# Patient Record
Sex: Male | Born: 1950 | Race: Black or African American | Hispanic: No | Marital: Married | Smoking: Never smoker
Health system: Southern US, Community
[De-identification: ages and names within clinical notes are randomized; demographics above are authoritative.]

## PROBLEM LIST (undated history)

## (undated) DIAGNOSIS — I10 Essential (primary) hypertension: Secondary | ICD-10-CM

---

## 2006-05-31 ENCOUNTER — Other Ambulatory Visit: Payer: Self-pay

## 2006-05-31 ENCOUNTER — Emergency Department: Payer: Self-pay | Admitting: Unknown Physician Specialty

## 2006-06-04 ENCOUNTER — Emergency Department: Payer: Self-pay | Admitting: Emergency Medicine

## 2008-05-23 ENCOUNTER — Ambulatory Visit: Payer: Self-pay | Admitting: Gastroenterology

## 2018-07-25 ENCOUNTER — Emergency Department
Admission: EM | Admit: 2018-07-25 | Discharge: 2018-07-25 | Disposition: A | Discharge status: Home / Self Care | Payer: BC Managed Care – PPO | Attending: Student in an Organized Health Care Education/Training Program | Admitting: Student in an Organized Health Care Education/Training Program

## 2018-07-25 ENCOUNTER — Other Ambulatory Visit: Payer: Self-pay

## 2018-07-25 ENCOUNTER — Emergency Department: Payer: BC Managed Care – PPO

## 2018-07-25 ENCOUNTER — Encounter: Payer: Self-pay | Admitting: Emergency Medicine

## 2018-07-25 DIAGNOSIS — M5412 Radiculopathy, cervical region: Secondary | ICD-10-CM | POA: Insufficient documentation

## 2018-07-25 DIAGNOSIS — M4802 Spinal stenosis, cervical region: Secondary | ICD-10-CM

## 2018-07-25 DIAGNOSIS — M9981 Other biomechanical lesions of cervical region: Secondary | ICD-10-CM | POA: Insufficient documentation

## 2018-07-25 DIAGNOSIS — R531 Weakness: Secondary | ICD-10-CM | POA: Diagnosis present

## 2018-07-25 LAB — BASIC METABOLIC PANEL
Anion gap: 8 (ref 5–15)
BUN: 10 mg/dL (ref 8–23)
CO2: 25 mmol/L (ref 22–32)
Calcium: 9.1 mg/dL (ref 8.9–10.3)
Chloride: 105 mmol/L (ref 98–111)
Creatinine, Ser: 1.29 mg/dL — ABNORMAL HIGH (ref 0.61–1.24)
GFR calc Af Amer: >60 mL/min (ref >60)
GFR calc non Af Amer: 57 mL/min — ABNORMAL LOW (ref >60)
Glucose, Bld: 117 mg/dL — ABNORMAL HIGH (ref 70–99)
Potassium: 3.3 mmol/L — ABNORMAL LOW (ref 3.5–5.1)
Sodium: 138 mmol/L (ref 135–145)

## 2018-07-25 LAB — CBC
HCT: 41.3 % (ref 39.0–52.0)
Hemoglobin: 14.1 g/dL (ref 13.0–17.0)
MCH: 28.4 pg (ref 26.0–34.0)
MCHC: 34.1 g/dL (ref 30.0–36.0)
MCV: 83.1 fL (ref 80.0–100.0)
Platelets: 338 10*3/uL (ref 150–400)
RBC: 4.97 MIL/uL (ref 4.22–5.81)
RDW: 14.4 % (ref 11.5–15.5)
WBC: 4.7 10*3/uL (ref 4.0–10.5)
nRBC: 0 % (ref 0.0–0.2)

## 2018-07-25 LAB — TROPONIN I (HIGH SENSITIVITY): Troponin I (High Sensitivity): 9 ng/L (ref <18)

## 2018-07-25 MED ORDER — PREDNISONE 10 MG PO TABS: ORAL_TABLET | ORAL | 0 refills | Status: DC

## 2018-07-25 MED ORDER — SODIUM CHLORIDE 0.9% FLUSH: 3.0000 mL | Freq: Once | INTRAVENOUS | Status: DC

## 2018-07-25 NOTE — Discharge Instructions (Signed)
I am prescribing you steroid taper to help with your "pinched nerve.  ".  Please return to the ER if you develop any worsening symptoms including weakness or pain or for any new symptoms.

## 2018-07-25 NOTE — ED Triage Notes (Signed)
Pt reports he woke up this am and his right shoulder was stiff. Pt states can move it but it is painful. Denies SOB, injuries or other sx's.

## 2018-07-25 NOTE — ED Notes (Signed)
Pt ambulatory to the BR, NAD noted. No concerns at this time.

## 2018-07-25 NOTE — ED Notes (Signed)
Patient transported to CT 

## 2018-07-25 NOTE — ED Provider Notes (Signed)
[facility] Emergency Department Provider Note    First MD Initiated Contact with Patient 07/25/18 [phone]     (approximate)  I have reviewed the triage vital signs and the nursing notes.   HISTORY  Chief Complaint Shoulder Pain    HPI Gabriel Bennett is a 68 y.o. male presents the ER for evaluation of 1 week of right hand weakness.  Denies any pain.  Denies any trauma.  Denies any headache.  No lower extremity symptoms.  States that he will intermittently feel his arm shaking and feels weak.  States he does have a history of "pinched nerve "in his neck.  No previous surgeries.  Denies any chest pain or back pain.  No nausea or vomiting.  No blurry vision.    History reviewed. No pertinent past medical history. No family history on file. History reviewed. No pertinent surgical history. There are no active problems to display for this patient.     Prior to Admission medications   Medication Sig Start Date End Date Taking? Authorizing Provider  predniSONE (DELTASONE) 10 MG tablet Gabriel Bennett 1-2: Take 50mg (5 pills) Gabriel Bennett 3-4: Take 40mg (4) Gabriel Bennett 5-6: 30mg (3) Gabriel Bennett 7-8: 20mg (2) Gabriel Bennett 9:10mg (1 07/25/18   Merlyn Lot, MD    Allergies Patient has no allergy information on record.    Social History Social History   Tobacco Use   Smoking status: Not on file  Substance Use Topics   Alcohol use: Not on file   Drug use: Not on file    Review of Systems Patient denies headaches, rhinorrhea, blurry vision, numbness, shortness of breath, chest pain, edema, cough, abdominal pain, nausea, vomiting, diarrhea, dysuria, fevers, rashes or hallucinations unless otherwise stated above in HPI. ____________________________________________   PHYSICAL EXAM:  VITAL SIGNS: Vitals:   07/25/18 1115 07/25/18 1124  BP:  (!) 169/87  Pulse: (!) 50   Resp: 18   Temp:    SpO2: 97%     Constitutional: Alert and oriented.  Eyes: Conjunctivae are normal.  Head: Atraumatic. Nose:  No congestion/rhinnorhea. Mouth/Throat: Mucous membranes are moist.   Neck: No stridor. Painless ROM.  Cardiovascular: Normal rate, regular rhythm. Grossly normal heart sounds.  Good peripheral circulation. Respiratory: Normal respiratory effort.  No retractions. Lungs CTAB. Gastrointestinal: Soft and nontender. No distention. No abdominal bruits. No CVA tenderness. Genitourinary:  Musculoskeletal: No lower extremity tenderness nor edema.  No joint effusions. Neurologic:  CN- intact.  No facial droop, Normal FNF.  Normal heel to shin.  Sensation intact bilaterally. Normal speech and language. No gross focal neurologic deficits are appreciated. No gait instability. Skin:  Skin is warm, dry and intact. No rash noted. Psychiatric: Mood and affect are normal. Speech and behavior are normal.  ____________________________________________   LABS (all labs ordered are listed, but only abnormal results are displayed)  Results for orders placed or performed during the hospital encounter of 07/25/18 (from the past 24 hour(s))  Basic metabolic panel     Status: Abnormal   Collection Time: 07/25/18  8:17 AM  Result Value Ref Range   Sodium 138 135 - 145 mmol/L   Potassium 3.3 (L) 3.5 - 5.1 mmol/L   Chloride 105 98 - 111 mmol/L   CO2 25 22 - 32 mmol/L   Glucose, Bld 117 (H) 70 - 99 mg/dL   BUN 10 8 - 23 mg/dL   Creatinine, Ser 1.29 (H) 0.61 - 1.24 mg/dL   Calcium 9.1 8.9 - 10.3 mg/dL   GFR calc non Af Amer 57 (L) >  60 mL/min   GFR calc Af Amer >60 >60 mL/min   Anion gap 8 5 - 15  CBC     Status: None   Collection Time: 07/25/18  8:17 AM  Result Value Ref Range   WBC 4.7 4.0 - 10.5 K/uL   RBC 4.97 4.22 - 5.81 MIL/uL   Hemoglobin 14.1 13.0 - 17.0 g/dL   HCT 16.141.3 09.639.0 - 04.552.0 %   MCV 83.1 80.0 - 100.0 fL   MCH 28.4 26.0 - 34.0 pg   MCHC 34.1 30.0 - 36.0 g/dL   RDW 40.914.4 81.111.5 - 91.415.5 %   Platelets 338 150 - 400 K/uL   nRBC 0.0 0.0 - 0.2 %  Troponin I (High Sensitivity)     Status: None    Collection Time: 07/25/18  8:17 AM  Result Value Ref Range   Troponin I (High Sensitivity) 9 <18 ng/L   ____________________________________________  EKG My review and personal interpretation at Time: 8:15   Indication: weakness  Rate: 70  Rhythm: sinus Axis: normal Other: normal intervals, no stemi, occasional pac ____________________________________________  RADIOLOGY  I personally reviewed all radiographic images ordered to evaluate for the above acute complaints and reviewed radiology reports and findings.  These findings were personally discussed with the patient.  Please see medical record for radiology report.  ____________________________________________   PROCEDURES  Procedure(s) performed:  Procedures    Critical Care performed: no ____________________________________________   INITIAL IMPRESSION / ASSESSMENT AND PLAN / ED COURSE  Pertinent labs & imaging results that were available during my care of the patient were reviewed by me and considered in my medical decision making (see chart for details).   DDX: Radiculopathy, rotator cuff injury, cervical stenosis, CVA, carpal tunnel, dissection  Gabriel Bennett is a 68 y.o. who presents to the ED with symptoms as described above.  Seems to be more radicular symptoms been pain or stiffness.  Will order CT imaging.  Does not seem consistent with CVA.  Anticipate cervical spine pathology.  Clinical Course as of Jul 24 1228  Tue Jul 25, 2018  1047 Discussed results of CT brain and cervical spine with patient.  States that he has had similar weakness in right upper extremity several years ago which improved with anti-inflammatories.  States the symptoms have been ongoing for 10 days and are actually improving therefore do not feel that he needs emergent MRI at this time.  Will treat for radiculopathy which would well explain the patient's symptoms with steroids and anti-inflammatories and give referral to neurosurgery.  We  discussed signs and symptoms for which the patient should return immediately to the hospital or seek medical care.  Have discussed with the patient and available family all diagnostics and treatments performed thus far and all questions were answered to the best of my ability. The patient demonstrates understanding and agreement with plan.    [PR]    Clinical Course User Index [PR] Willy Eddyobinson, Nicolas Sisler, MD    The patient was evaluated in Emergency Department today for the symptoms described in the history of present illness. He/she was evaluated in the context of the global COVID-19 pandemic, which necessitated consideration that the patient might be at risk for infection with the SARS-CoV-2 virus that causes COVID-19. Institutional protocols and algorithms that pertain to the evaluation of patients at risk for COVID-19 are in a state of rapid change based on information released by regulatory bodies including the CDC and federal and state organizations. These policies and algorithms were followed  during the patient's care in the ED.   As part of my medical decision making, I reviewed the following data within the electronic MEDICAL RECORD NUMBER Nursing notes reviewed and incorporated, Labs reviewed, notes from prior ED visits and [place] Controlled Substance Database   ____________________________________________   FINAL CLINICAL IMPRESSION(S) / ED DIAGNOSES  Final diagnoses:  Neural foraminal stenosis of cervical spine      NEW MEDICATIONS STARTED DURING THIS VISIT:  Discharge Medication List as of 07/25/2018 10:54 AM    START taking these medications   Details  predniSONE (DELTASONE) 10 MG tablet [place] 1-2: Take 50mg (5 pills) [place] 3-4: Take 40mg (4) Maddy 5-6: 30mg (3) Geisler 7-8: 20mg (2) [place] 9:10mg (1, Normal         Note:  This document was prepared using Dragon voice recognition software and may include unintentional dictation errors.    Willy Eddyobinson, Tiffannie Sloss, MD 07/25/18 1230

## 2019-06-26 ENCOUNTER — Emergency Department: Payer: 59

## 2019-06-26 ENCOUNTER — Encounter: Payer: Self-pay | Admitting: Emergency Medicine

## 2019-06-26 ENCOUNTER — Emergency Department
Admission: EM | Admit: 2019-06-26 | Discharge: 2019-06-26 | Disposition: A | Discharge status: Home / Self Care | Payer: 59 | Attending: Emergency Medicine | Admitting: Emergency Medicine

## 2019-06-26 ENCOUNTER — Other Ambulatory Visit: Payer: Self-pay

## 2019-06-26 DIAGNOSIS — I10 Essential (primary) hypertension: Secondary | ICD-10-CM | POA: Insufficient documentation

## 2019-06-26 DIAGNOSIS — M7989 Other specified soft tissue disorders: Secondary | ICD-10-CM

## 2019-06-26 DIAGNOSIS — R600 Localized edema: Secondary | ICD-10-CM | POA: Diagnosis not present

## 2019-06-26 HISTORY — DX: Essential (primary) hypertension: I10

## 2019-06-26 LAB — BASIC METABOLIC PANEL
Anion gap: 5 (ref 5–15)
BUN: 9 mg/dL (ref 8–23)
CO2: 29 mmol/L (ref 22–32)
Calcium: 9.3 mg/dL (ref 8.9–10.3)
Chloride: 106 mmol/L (ref 98–111)
Creatinine, Ser: 1.18 mg/dL (ref 0.61–1.24)
GFR calc Af Amer: >60 mL/min (ref >60)
GFR calc non Af Amer: >60 mL/min (ref >60)
Glucose, Bld: 104 mg/dL — ABNORMAL HIGH (ref 70–99)
Potassium: 3.9 mmol/L (ref 3.5–5.1)
Sodium: 140 mmol/L (ref 135–145)

## 2019-06-26 LAB — CBC WITH DIFFERENTIAL/PLATELET
Abs Immature Granulocytes: 0.01 10*3/uL (ref 0.00–0.07)
Basophils Absolute: 0.1 10*3/uL (ref 0.0–0.1)
Basophils Relative: 1 %
Eosinophils Absolute: 0.2 10*3/uL (ref 0.0–0.5)
Eosinophils Relative: 3 %
HCT: 42 % (ref 39.0–52.0)
Hemoglobin: 14.6 g/dL (ref 13.0–17.0)
Immature Granulocytes: 0 %
Lymphocytes Relative: 25 %
Lymphs Abs: 1.4 10*3/uL (ref 0.7–4.0)
MCH: 29.3 pg (ref 26.0–34.0)
MCHC: 34.8 g/dL (ref 30.0–36.0)
MCV: 84.2 fL (ref 80.0–100.0)
Monocytes Absolute: 0.5 10*3/uL (ref 0.1–1.0)
Monocytes Relative: 9 %
Neutro Abs: 3.5 10*3/uL (ref 1.7–7.7)
Neutrophils Relative %: 62 %
Platelets: 326 10*3/uL (ref 150–400)
RBC: 4.99 MIL/uL (ref 4.22–5.81)
RDW: 14.4 % (ref 11.5–15.5)
WBC: 5.6 10*3/uL (ref 4.0–10.5)
nRBC: 0 % (ref 0.0–0.2)

## 2019-06-26 MED ORDER — FUROSEMIDE 20 MG PO TABS: 20.0000 mg | ORAL_TABLET | Freq: Every day | ORAL | 0 refills | Status: DC

## 2019-06-26 NOTE — ED Notes (Addendum)
Pt in US

## 2019-06-26 NOTE — ED Provider Notes (Signed)
°  ER Provider Note       Time seen: 12:35 PM    I have reviewed the vital signs and the nursing notes.  HISTORY   Chief Complaint Leg Swelling    HPI Gabriel Bennett is a 69 y.o. male with a history of hypertension who presents today for left lower leg swelling since Friday with pain beginning today.  Patient denies any history of blood clots.  Patient was sent from urgent care to rule out DVT.  Discomfort is 6 out of 10 in the left leg.  Past Medical History:  Diagnosis Date   Hypertension     History reviewed. No pertinent surgical history.  Allergies Patient has no known allergies.  Review of Systems Constitutional: Negative for fever. Cardiovascular: Negative for chest pain. Respiratory: Negative for shortness of breath. Gastrointestinal: Negative for abdominal pain, vomiting and diarrhea. Musculoskeletal: Positive for left leg pain and swelling Skin: Negative for rash. Neurological: Negative for headaches, focal weakness or numbness.  All systems negative/normal/unremarkable except as stated in the HPI  ____________________________________________   PHYSICAL EXAM:  VITAL SIGNS: Vitals:   06/26/19 1112  BP: (!) 163/97  Pulse: 61  Resp: 16  Temp: 98.1 F (36.7 C)  SpO2: 99%    Constitutional: Alert and oriented. Well appearing and in no distress. Eyes: Conjunctivae are normal. Normal extraocular movements. Cardiovascular: Normal rate, regular rhythm. No murmurs, rubs, or gallops. Respiratory: Normal respiratory effort without tachypnea nor retractions. Breath sounds are clear and equal bilaterally. No wheezes/rales/rhonchi. Gastrointestinal: Soft and nontender. Normal bowel sounds Musculoskeletal: Lower extremity edema is noted Neurologic:  Normal speech and language. No gross focal neurologic deficits are appreciated.  Skin:  Skin is warm, dry and intact. No rash noted. Psychiatric: Speech and behavior are normal.   ____________________________________________   LABS (pertinent positives/negatives)  Labs Reviewed  BASIC METABOLIC PANEL - Abnormal; Notable for the following components:      Result Value   Glucose, Bld 104 (*)    All other components within normal limits  CBC WITH DIFFERENTIAL/PLATELET    RADIOLOGY  Images were viewed by me Left lower extremity ultrasound is unremarkable for DVT  DIFFERENTIAL DIAGNOSIS  Peripheral edema, DVT, cellulitis, Baker's cyst  ASSESSMENT AND PLAN  Peripheral edema   Plan: The patient had presented for peripheral edema mostly in the left leg.  Ultrasound was negative for DVT.  Patient's labs do not reveal any acute process.  He was placed on short course of Lasix, we have discussed compression stockings and elevation.  He is cleared for outpatient follow-up.  Daryel November MD    Note: This note was generated in part or whole with voice recognition software. Voice recognition is usually quite accurate but there are transcription errors that can and very often do occur. I apologize for any typographical errors that were not detected and corrected.     Emily Filbert, MD 06/26/19 1357

## 2019-06-26 NOTE — ED Triage Notes (Signed)
Taken to US.

## 2019-06-26 NOTE — ED Notes (Signed)
Pt alert and oriented X 4, stable for discharge. RR even and unlabored, color WNL. Discussed discharge instructions and follow up when appropriate. Instructed to follow up with ER for any life threatening symptoms or concerns that patient or family of patient may have Left with all of belongings.

## 2019-06-26 NOTE — ED Triage Notes (Signed)
Patient presents to the ED with left lower leg swelling since Friday and pain beginning today.  Patient denies history of blood clots.  Patient's left leg appears slightly larger than right.  Patient ambulatory to triage with no obvious distress.

## 2019-06-26 NOTE — ED Triage Notes (Signed)
First nurse note- from UC r/o dvt. Ambulatory, NAD

## 2019-12-19 ENCOUNTER — Other Ambulatory Visit: Payer: Self-pay

## 2019-12-19 ENCOUNTER — Ambulatory Visit: Payer: Medicare Other

## 2019-12-19 DIAGNOSIS — Z23 Encounter for immunization: Secondary | ICD-10-CM

## 2021-06-10 ENCOUNTER — Observation Stay
Admission: EM | Admit: 2021-06-10 | Discharge: 2021-06-11 | Disposition: A | Discharge status: Home / Self Care | Payer: Medicare Other | Attending: Osteopathic Medicine | Admitting: Osteopathic Medicine

## 2021-06-10 ENCOUNTER — Observation Stay (HOSPITAL_BASED_OUTPATIENT_CLINIC_OR_DEPARTMENT_OTHER)
Admit: 2021-06-10 | Discharge: 2021-06-10 | Disposition: A | Discharge status: Home / Self Care | Payer: Medicare Other | Attending: Internal Medicine | Admitting: Internal Medicine

## 2021-06-10 ENCOUNTER — Emergency Department: Payer: Medicare Other

## 2021-06-10 ENCOUNTER — Other Ambulatory Visit: Payer: Self-pay

## 2021-06-10 DIAGNOSIS — D509 Iron deficiency anemia, unspecified: Secondary | ICD-10-CM | POA: Diagnosis not present

## 2021-06-10 DIAGNOSIS — D75839 Thrombocytosis, unspecified: Secondary | ICD-10-CM | POA: Diagnosis not present

## 2021-06-10 DIAGNOSIS — R001 Bradycardia, unspecified: Secondary | ICD-10-CM | POA: Diagnosis present

## 2021-06-10 DIAGNOSIS — R55 Syncope and collapse: Secondary | ICD-10-CM | POA: Diagnosis present

## 2021-06-10 DIAGNOSIS — Z79899 Other long term (current) drug therapy: Secondary | ICD-10-CM | POA: Insufficient documentation

## 2021-06-10 DIAGNOSIS — I10 Essential (primary) hypertension: Secondary | ICD-10-CM | POA: Diagnosis present

## 2021-06-10 DIAGNOSIS — E785 Hyperlipidemia, unspecified: Secondary | ICD-10-CM | POA: Diagnosis not present

## 2021-06-10 LAB — CBC
HCT: 38.7 % — ABNORMAL LOW (ref 39.0–52.0)
Hemoglobin: 12.8 g/dL — ABNORMAL LOW (ref 13.0–17.0)
MCH: 24.5 pg — ABNORMAL LOW (ref 26.0–34.0)
MCHC: 33.1 g/dL (ref 30.0–36.0)
MCV: 74 fL — ABNORMAL LOW (ref 80.0–100.0)
Platelets: 1144 10*3/uL (ref 150–400)
RBC: 5.23 MIL/uL (ref 4.22–5.81)
RDW: 18.1 % — ABNORMAL HIGH (ref 11.5–15.5)
WBC: 9.3 10*3/uL (ref 4.0–10.5)
nRBC: 0 % (ref 0.0–0.2)

## 2021-06-10 LAB — ECHOCARDIOGRAM COMPLETE
AR max vel: 2.57 cm2
AV Area VTI: 2.62 cm2
AV Area mean vel: 2.62 cm2
AV Mean grad: 4 mmHg
AV Peak grad: 7.6 mmHg
Ao pk vel: 1.38 m/s
Area-P 1/2: 2.21 cm2
MV VTI: 2.94 cm2
S' Lateral: 2.97 cm

## 2021-06-10 LAB — URINALYSIS, ROUTINE W REFLEX MICROSCOPIC
Bilirubin Urine: NEGATIVE
Glucose, UA: NEGATIVE mg/dL
Hgb urine dipstick: NEGATIVE
Ketones, ur: NEGATIVE mg/dL
Leukocytes,Ua: NEGATIVE
Nitrite: NEGATIVE
Protein, ur: NEGATIVE mg/dL
Specific Gravity, Urine: 1.012 (ref 1.005–1.030)
pH: 6 (ref 5.0–8.0)

## 2021-06-10 LAB — TSH: TSH: 1.102 u[IU]/mL (ref 0.350–4.500)

## 2021-06-10 LAB — BASIC METABOLIC PANEL
Anion gap: 6 (ref 5–15)
BUN: 13 mg/dL (ref 8–23)
CO2: 25 mmol/L (ref 22–32)
Calcium: 9.6 mg/dL (ref 8.9–10.3)
Chloride: 106 mmol/L (ref 98–111)
Creatinine, Ser: 1.24 mg/dL (ref 0.61–1.24)
GFR, Estimated: >60 mL/min (ref >60)
Glucose, Bld: 90 mg/dL (ref 70–99)
Potassium: 3.7 mmol/L (ref 3.5–5.1)
Sodium: 137 mmol/L (ref 135–145)

## 2021-06-10 LAB — FOLATE: Folate: 23 ng/mL (ref >5.9)

## 2021-06-10 LAB — VITAMIN B12: Vitamin B-12: 261 pg/mL (ref 180–914)

## 2021-06-10 LAB — TROPONIN I (HIGH SENSITIVITY): Troponin I (High Sensitivity): 11 ng/L (ref <18)

## 2021-06-10 LAB — MAGNESIUM: Magnesium: 2.2 mg/dL (ref 1.7–2.4)

## 2021-06-10 LAB — IRON AND TIBC
Iron: 42 ug/dL — ABNORMAL LOW (ref 45–182)
Saturation Ratios: 12 % — ABNORMAL LOW (ref 17.9–39.5)
TIBC: 339 ug/dL (ref 250–450)
UIBC: 297 ug/dL

## 2021-06-10 LAB — PATHOLOGIST SMEAR REVIEW

## 2021-06-10 LAB — HIV ANTIBODY (ROUTINE TESTING W REFLEX): HIV Screen 4th Generation wRfx: NONREACTIVE

## 2021-06-10 MED ORDER — SODIUM CHLORIDE 0.9% FLUSH
3.0000 mL | Freq: Two times a day (BID) | INTRAVENOUS | Status: DC
  Administered 2021-06-10 – 2021-06-11 (×3): 3 mL via INTRAVENOUS

## 2021-06-10 MED ORDER — ONDANSETRON HCL 4 MG PO TABS: 4.0000 mg | ORAL_TABLET | Freq: Four times a day (QID) | ORAL | Status: DC | PRN

## 2021-06-10 MED ORDER — SODIUM CHLORIDE 0.9% FLUSH: 3.0000 mL | INTRAVENOUS | Status: DC | PRN

## 2021-06-10 MED ORDER — ACETAMINOPHEN 650 MG RE SUPP: 650.0000 mg | Freq: Four times a day (QID) | RECTAL | Status: DC | PRN

## 2021-06-10 MED ORDER — ENOXAPARIN SODIUM 40 MG/0.4ML IJ SOSY
40.0000 mg | PREFILLED_SYRINGE | INTRAMUSCULAR | Status: DC
  Administered 2021-06-10: 40 mg via SUBCUTANEOUS
  Filled 2021-06-10: qty 0.4

## 2021-06-10 MED ORDER — ONDANSETRON HCL 4 MG/2ML IJ SOLN: 4.0000 mg | Freq: Four times a day (QID) | INTRAMUSCULAR | Status: DC | PRN

## 2021-06-10 MED ORDER — SODIUM CHLORIDE 0.9 % IV BOLUS
500.0000 mL | Freq: Once | INTRAVENOUS | Status: AC
  Administered 2021-06-10: 500 mL via INTRAVENOUS

## 2021-06-10 MED ORDER — ACETAMINOPHEN 325 MG PO TABS: 650.0000 mg | ORAL_TABLET | Freq: Four times a day (QID) | ORAL | Status: DC | PRN

## 2021-06-10 MED ORDER — SODIUM CHLORIDE 0.9 % IV SOLN: 250.0000 mL | INTRAVENOUS | Status: DC | PRN

## 2021-06-10 NOTE — Assessment & Plan Note (Signed)
-   Blood pressure is stable °-Continue lisinopril °

## 2021-06-10 NOTE — Assessment & Plan Note (Signed)
Patient had a near syncopal episode which he described as feeling nauseous, dizzy, lightheaded and diaphoretic. He denied loss of consciousness but was noted to be bradycardic with heart rate in the 40s and 50s. Medications reviewed and patient is not on any beta-blockers or calcium channel blockers Obtain TSH Obtain 2D echocardiogram to assess LVEF and rule out aortic stenosis Place patient on a cardiac monitor to rule out arrhythmias He had a 48 hour Holter scan in 01/23 which showed that the patient was in sinus rhythm, sinus bradycardia, sinus tachycardia and sinus arrhythmia. Ventricular ectopic activity consisted of multifocal PVCs?<   including couplets. Supraventricular ectopic activity consisted of PACs?<  including atrial pairs , bigeminy and trigeminy and 3 beat atrial run, 83 BPM. There were no pauses greater than 2.0 seconds noted

## 2021-06-10 NOTE — ED Triage Notes (Signed)
Pt comes with c/o near syncopal episode. Pt was visiting his wife and became lightheaded and diaphoretic. Med alert was called and pt brought to triage. Pt is sweaty and pale appearing. Pt states hx of this and required blood transfusion.  Pt denies any pain. Pt denies any SOb or dizziness.

## 2021-06-10 NOTE — Progress Notes (Signed)
Admission profile updated. ?

## 2021-06-10 NOTE — Progress Notes (Signed)
*  PRELIMINARY RESULTS* Echocardiogram 2D Echocardiogram has been performed.  Joanette Gula Rishika Mccollom 06/10/2021, 3:42 PM

## 2021-06-10 NOTE — Assessment & Plan Note (Signed)
Treatment as outlined in 1 

## 2021-06-10 NOTE — H&P (Signed)
History and Physical    PatientWolf Boulay Bennett UUV:[id number] DOB: December 10, 1950 DOA: 06/10/2021 DOS: the patient was seen and examined on 06/10/2021 PCP: Dwana Melena, PA  Patient coming from: Home  Chief Complaint: No chief complaint on file.  HPI: Gabriel Bennett is a 71 y.o. male with medical history significant for prior GI bleed, hypertension, essential tremor, thrombocytosis who was brought into the ER from the medical floor where he was visiting with his wife who recently had a stroke. Patient states that he was sitting down when he suddenly felt nauseous, dizzy and lightheaded like he was going to pass out.  He denies any loss of consciousness and stated that he knew that if he stood up he would likely pass out. Has had prior syncopal episodes in the past but at that time he had a GI bleed. He denies having any rectal bleeding, no melena stools, no hematochezia or hematemesis.  He denies having any abdominal pain, no changes in his bowel habits, no emesis, no chest pain, no shortness of breath, no headache, no fever, no chills, no leg swelling, no focal deficit or blurred vision. He complains of feeling overall weak and fatigued. Did not have orthostatic blood pressure changes in the ER  Review of Systems: As mentioned in the history of present illness. All other systems reviewed and are negative. Past Medical History:  Diagnosis Date   Hypertension    History reviewed. No pertinent surgical history. Social History:  reports that he has never smoked. He has never used smokeless tobacco. He reports current alcohol use. No history on file for drug use.  No Known Allergies  No family history on file.  Prior to Admission medications   Medication Sig Start Date End Date Taking? Authorizing Provider  furosemide (LASIX) 20 MG tablet Take 1 tablet (20 mg total) by mouth daily for 5 days. 06/26/19 07/01/19  Emily Filbert, MD  predniSONE (DELTASONE) 10 MG tablet [place] 1-2: Take 50mg (5 pills)  [place] 3-4: Take 40mg (4) [place] 5-6: 30mg (3) [place] 7-8: 20mg (2) [place] 9:10mg (1 07/25/18   , MD    Physical Exam: Vitals:   06/10/21 1051 06/10/21 1135 06/10/21 1153 06/10/21 1234  BP: (!) 158/93   (!) 141/95  Pulse: (!) 51 (!) 47 (!) 46 (!) 51  Resp: 19 (!) 22 17 14   Temp: 97.6 F (36.4 C)     SpO2: 95% 97% 93% 98%   Physical Exam Vitals and nursing note reviewed.  Constitutional:      Appearance: He is obese.     Comments: Chronically ill-appearing  HENT:     Head: Normocephalic and atraumatic.     Nose: Nose normal.     Mouth/Throat:     Mouth: Mucous membranes are moist.  Eyes:     Comments: Pale conjunctiva  Cardiovascular:     Rate and Rhythm: Bradycardia present.     Pulses: Normal pulses.  Pulmonary:     Effort: Pulmonary effort is normal.     Breath sounds: Normal breath sounds.  Abdominal:     Palpations: Abdomen is soft.     Comments: Central adiposity  Musculoskeletal:        General: Normal range of motion.     Cervical back: Normal range of motion and neck supple.  Skin:    General: Skin is warm and dry.  Neurological:     General: No focal deficit present.     Mental Status: He is alert.     Motor: Weakness  present.  Psychiatric:        Mood and Affect: Mood normal.        Behavior: Behavior normal.    Data Reviewed: Relevant notes from primary care and specialist visits, past discharge summaries as available in EHR, including Care Everywhere. Prior diagnostic testing as pertinent to current admission diagnoses Updated medications and problem lists for reconciliation ED course, including vitals, labs, imaging, treatment and response to treatment Triage notes, nursing and pharmacy notes and ED provider's notes Notable results as noted in HPI Labs reviewed.  Troponin 11, sodium 137, potassium 3 chloride 106, bicarb 25, glucose 90, BUN 13, creatinine 1.24, white count 9.3, hemoglobin 12.8, hematocrit 38.7, MCV 74, platelet count 1144 Chest x-ray  reviewed by me shows Mild bibasilar airspace disease right greater than left. Probable atelectasis. CT scan of the head without contrast shows no evidence of acute intracranial abnormality.Advanced chronic small vessel ischemic changes within the cerebral white matter.Chronic small vessel ischemic changes also present within the thalami. Twelve-lead EKG reviewed by me shows sinus bradycardia with first-degree AV block. There are no new results to review at this time.  Assessment and Plan: * Vasovagal near-syncope Patient had a near syncopal episode which he described as feeling nauseous, dizzy, lightheaded and diaphoretic. He denied loss of consciousness but was noted to be bradycardic with heart rate in the 40s and 50s. Medications reviewed and patient is not on any beta-blockers or calcium channel blockers Obtain TSH Obtain 2D echocardiogram to assess LVEF and rule out aortic stenosis Place patient on a cardiac monitor to rule out arrhythmias He had a 48 hour Holter scan in 01/23 which showed that the patient was in sinus rhythm, sinus bradycardia, sinus tachycardia and sinus arrhythmia. Ventricular ectopic activity consisted of multifocal PVCs?<   including couplets. Supraventricular ectopic activity consisted of PACs?<  including atrial pairs , bigeminy and trigeminy and 3 beat atrial run, 83 BPM. There were no pauses greater than 2.0 seconds noted  Sinus bradycardia Treatment as outlined in 1  Hypertension Blood pressure is stable Continue lisinopril  Thrombocytosis Patient noted to have thrombocytosis with associated microcytic anemia This may be reactive and due to iron deficiency Obtain iron panel Hematology consult      Advance Care Planning:   Code Status: Full Code   Consults: Cardiology  Family Communication: Greater than 50% of time was spent discussing patient's condition and plan of care with him at the bedside.  All questions and concerns have been addressed.   He verbalizes understanding and agrees with the plan.  Severity of Illness: The appropriate patient status for this patient is OBSERVATION. Observation status is judged to be reasonable and necessary in order to provide the required intensity of service to ensure the patient's safety. The patient's presenting symptoms, physical exam findings, and initial radiographic and laboratory data in the context of their medical condition is felt to place them at decreased risk for further clinical deterioration. Furthermore, it is anticipated that the patient will be medically stable for discharge from the hospital within 2 midnights of admission.   Author: Lucile Shutters, MD 06/10/2021 2:36 PM  For on call review [email or web address].

## 2021-06-10 NOTE — Plan of Care (Signed)
  Problem: Education: Goal: Knowledge of General Education information will improve Description: Including pain rating scale, medication(s)/side effects and non-pharmacologic comfort measures Outcome: Progressing   Problem: Health Behavior/Discharge Planning: Goal: Ability to manage health-related needs will improve Outcome: Progressing   Problem: Clinical Measurements: Goal: Ability to maintain clinical measurements within normal limits will improve Outcome: Progressing   Problem: Clinical Measurements: Goal: Will remain free from infection Outcome: Progressing   Problem: Clinical Measurements: Goal: Diagnostic test results will improve Outcome: Progressing   Problem: Clinical Measurements: Goal: Respiratory complications will improve Outcome: Progressing   Problem: Clinical Measurements: Goal: Cardiovascular complication will be avoided Outcome: Progressing   Problem: Activity: Goal: Risk for activity intolerance will decrease Outcome: Progressing   Problem: Nutrition: Goal: Adequate nutrition will be maintained Outcome: Progressing   Problem: Coping: Goal: Level of anxiety will decrease Outcome: Progressing   Problem: Elimination: Goal: Will not experience complications related to bowel motility Outcome: Progressing   Problem: Safety: Goal: Ability to remain free from injury will improve Outcome: Progressing

## 2021-06-10 NOTE — Consult Note (Signed)
Cardiology Consultation:   Patient ID: Gabriel Bennett MRN: [id number]; DOB: 08/23/1950  Admit date: 06/10/2021 Date of Consult: 06/10/2021  PCP:  Dwana MelenaScott, Gabriel B, PA   [facility] Prov[facility] Cardiologist: Gabriel Bennett  Patient Profile:   Gabriel Bennett is a 71 y.o. male with a hx of GIB, HTN, tremor, thrombocytosis who is being seen 06/10/2021 for the evaluation of near syncope at the request of Dr. Joylene IgoAgbata.  History of Present Illness:   [name] with no significant cardiac work-up. No h/o MI or stent. Mother had MI in her 6380s. Remote h/o marijuana and alcohol use. No tobacco use. He takes amlodipine for HTN.   H/o upper GIB in December 2022 with syncope and collapse. Subsequent Echo showed LVEF 56%, mild LVH. Heart monitor 48 hours showed SR, PVCs, brief SVT, no pauses.   The patient presented to the ER 06/10/21 for near syncope. Has been visiting his wife, who is admitted to the hospital for recent stroke. He was sitting down when he started feeling nauseous and became diaphoretic, as if he was going to pass out. He felt weak all over. He stayed sitting in the chair and didn't pass out. No chest pain. Denies overt bleeding. He was taken to the ER for evaluation.   In the ER BP 158/93, HR 51bpm, RR 19, afebrile. Labs showed 12.8, plt 1,144, K 3.7, sodium 137, BG90, Scr 1.24, BUN 13, WBC 9.3. CXR with likely atelectasis. CTH showed no acute abnormality. EKG shows SB, first degree AV block with no ischemic changes. He was admitted for further work-up.   Past Medical History:  Diagnosis Date   Hypertension     History reviewed. No pertinent surgical history.   Home Medications:  Prior to Admission medications   Medication Sig Start Date End Date Taking? Authorizing Provider  acetaminophen (TYLENOL) 500 MG tablet Take 500 mg by mouth every 6 (six) hours as needed.   Yes [provider]  amLODipine (NORVASC) 5 MG tablet Take 5 mg by mouth daily.   Yes [provider]  atorvastatin  (LIPITOR) 40 MG tablet Take 40 mg by mouth daily.   Yes [provider]  Ferrous Fumarate 324 MG TABS Take 325 mg by mouth daily in the afternoon.   Yes [provider]  folic acid (FOLVITE) 1 MG tablet Take 1 mg by mouth daily.   Yes [provider]  pantoprazole (PROTONIX) 40 MG tablet Take 40 mg by mouth daily.   Yes [provider]  primidone (MYSOLINE) 50 MG tablet Take 100 mg by mouth at bedtime.   Yes [provider]  sildenafil (VIAGRA) 50 MG tablet Take 50 mg by mouth daily as needed for erectile dysfunction.   Yes [provider]  furosemide (LASIX) 20 MG tablet Take 1 tablet (20 mg total) by mouth daily for 5 days. 06/26/19 07/01/19  Emily FilbertWilliams, Jonathan E, MD  predniSONE (DELTASONE) 10 MG tablet Mitschke 1-2: Take 50mg (5 pills) Dandy 3-4: Take 40mg (4) Masters 5-6: 30mg (3) Ureste 7-8: 20mg (2) Burling 9:10mg (1 Patient not taking: Reported on 06/10/2021 07/25/18   Willy Eddyobinson, Patrick, MD    Inpatient Medications: Scheduled Meds:  enoxaparin (LOVENOX) injection  40 mg Subcutaneous Q24H   sodium chloride flush  3 mL Intravenous Q12H   sodium chloride flush  3 mL Intravenous Q12H   Continuous Infusions:  sodium chloride     PRN Meds: sodium chloride, acetaminophen **OR** acetaminophen, ondansetron **OR** ondansetron (ZOFRAN) IV, sodium chloride flush  Allergies:   No Known Allergies  Social History:   Social History   Socioeconomic History   Marital status: Married    Spouse name: Not on file   Number of children: Not on file   Years of education: Not on file   Highest education level: Not on file  Occupational History   Not on file  Tobacco Use   Smoking status: Never   Smokeless tobacco: Never  Substance and Sexual Activity   Alcohol use: Yes    Comment: twice per month   Drug use: Not on file   Sexual activity: Not on file  Other Topics Concern   Not on file  Social History Narrative   Not on file   Social Determinants of Health    Financial Resource Strain: Not on file  Food Insecurity: Not on file  Transportation Needs: Not on file  Physical Activity: Not on file  Stress: Not on file  Social Connections: Not on file  Intimate Partner Violence: Not on file    Family History:   Family History  Problem Relation Age of Onset   Coronary artery disease Mother      ROS:  Please see the history of present illness.   All other ROS reviewed and negative.     Physical Exam/Data:   Vitals:   06/10/21 1504 06/10/21 1509 06/10/21 1510 06/10/21 1515  BP: (!) 161/91 (!) 146/89 (!) 154/90 (!) 146/89  Pulse: (!) 50 (!) 52 (!) 51 (!) 52  Resp:      Temp: 97.6 F (36.4 C)     TempSrc:      SpO2: 100% 100% 100% 100%    Intake/Output Summary (Last 24 hours) at 06/10/2021 1645 Last data filed at 06/10/2021 1318 Gross per 24 hour  Intake 500 ml  Output --  Net 500 ml      06/26/2019   11:13 AM 07/25/2018    8:03 AM  Last 3 Weights  Weight (lbs) 280 lb 280 lb  Weight (kg) 127.007 kg 127.007 kg     There is no height or weight on file to calculate BMI.  General:  Well nourished, well developed, in no acute distress HEENT: normal Neck: no JVD Vascular: No carotid bruits; Distal pulses 2+ bilaterally Cardiac:  normal S1, S2; RRR; no murmur  Lungs:  clear to auscultation bilaterally, no wheezing, rhonchi or rales  Abd: soft, nontender, no hepatomegaly  Ext: no edema Musculoskeletal:  No deformities, BUE and BLE strength normal and equal Skin: warm and dry  Neuro:  CNs 2-12 intact, no focal abnormalities noted Psych:  Normal affect   EKG:  The EKG was personally reviewed and demonstrates:  SB 1st degree AV block, 54bpm, poor baseline, no ST/T wave changes Telemetry:  Telemetry was personally reviewed and demonstrates:  SB upper 40-50s  Relevant CV Studies:  Echo ordered  48 hour heart monitor Diagnosis: Syncope and collapse [R55]   Conclusions: 1)  This 48 hour Holter scan was adequate for  interpretation. 2)  The patient was in sinus rhythm, sinus bradycardia, sinus tachycardia and sinus arrhythmia (strips 15-16).?< 3)  Ventricular ectopic activity consisted of multifocal PVCs?<  (strips 10,13) including couplets (strips 8,14) . 4)  Supraventricular ectopic activity consisted of PACs?< (strips 5,9) including atrial pairs (strips 6,11), bigeminy (strip 7) and trigeminy (strip 12) and 3 beat atrial run, 83 BPM at 6:46:49 AM (2)  (strip 17). 5)  There were no pauses greater than 2.0 seconds noted.?< 6)  There were no symptoms noted in diary nor  was the event button activated.?< ?< I have reviewed the ECG tracings and report, amended them as necessary and agree with the  descriptions /interpretations enumerated on this summary page.  Reviewing Physician:  Signed By: Melene Plan, M.B., CH.B.  Date: 02/06/2021   Echo 01/25/21 INTERPRETATION ---------------------------------------------------------------    NORMAL LEFT VENTRICULAR SYSTOLIC FUNCTION WITH MILD LVH    NORMAL RIGHT VENTRICULAR SYSTOLIC FUNCTION    VALVULAR REGURGITATION: MILD MR, MILD TR    NO VALVULAR STENOSIS    NO PRIOR STUDY FOR COMPARISON   Laboratory Data:  High Sensitivity Troponin:   Recent Labs  Lab 06/10/21 1047  TROPONINIHS 11     Chemistry Recent Labs  Lab 06/10/21 1047  NA 137  K 3.7  CL 106  CO2 25  GLUCOSE 90  BUN 13  CREATININE 1.24  CALCIUM 9.6  GFRNONAA >60  ANIONGAP 6    No results for input(s): PROT, ALBUMIN, AST, ALT, ALKPHOS, BILITOT in the last 168 hours. Lipids No results for input(s): CHOL, TRIG, HDL, LABVLDL, LDLCALC, CHOLHDL in the last 168 hours.  Hematology Recent Labs  Lab 06/10/21 1047  WBC 9.3  RBC 5.23  HGB 12.8*  HCT 38.7*  MCV 74.0*  MCH 24.5*  MCHC 33.1  RDW 18.1*  PLT 1,144*   Thyroid No results for input(s): TSH, FREET4 in the last 168 hours.  BNPNo results for input(s): BNP, PROBNP in the last 168 hours.  DDimer No results for input(s): DDIMER  in the last 168 hours.   Radiology/Studies:  CT HEAD WO CONTRAST ( )  Result Date: 06/10/2021 CLINICAL DATA:  Provided history: Syncope/presyncope, cerebrovascular cause suspected. EXAM: CT HEAD WITHOUT CONTRAST TECHNIQUE: Contiguous axial images were obtained from the base of the skull through the vertex without intravenous contrast. RADIATION DOSE REDUCTION: This exam was performed according to the departmental dose-optimization program which includes automated exposure control, adjustment of the mA and/or kV according to patient size and/or use of iterative reconstruction technique. COMPARISON:  Head CT 07/25/2018. FINDINGS: Brain: Cerebral volume is normal. Advanced patchy and ill-defined hypoattenuation within the cerebral white matter, nonspecific but compatible with chronic small vessel ischemic disease. 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 Chronic small vessel ischemic changes are also present within the thalami. There is no acute intracranial hemorrhage. No demarcated cortical infarct. No extra-axial fluid collection. No evidence of an intracranial mass. No midline shift. Vascular: No hyperdense vessel. Atherosclerotic calcifications. Skull: No fracture or aggressive osseous lesion. Sinuses/Orbits: No mass or acute finding within the imaged orbits. Trace mucosal thickening within the bilateral sphenoid sinuses. IMPRESSION: No evidence of acute intracranial abnormality. Advanced chronic small vessel ischemic changes within the cerebral white matter. Chronic small vessel ischemic changes also present within the thalami. Electronically Signed   By: Jackey Loge D.O.   On: 06/10/2021 13:04   DG Chest Portable 1 View  Result Date: 06/10/2021 CLINICAL DATA:  Syncope EXAM: PORTABLE CHEST 1 VIEW COMPARISON:  07/25/2018 FINDINGS: Heart size and vascularity within normal limits. Mild bibasilar airspace disease right greater than left. No effusion.  Upper lobes clear. IMPRESSION: Mild bibasilar airspace disease right greater than left. Probable atelectasis. Electronically Signed   By: Marlan Palau M.D.   On: 06/10/2021 12:13     Assessment and Plan:   Near-Syncope - suspect vasovagal in nature - Labs showed elevated platelet count, otherwise unremarkable - HR in the upper 40-50s, however may have low baseline HR. He is not on rate lowering medications -  BP good. Check orthostatics - Echo ordered. Prior echo with normal LVEF and mild valvular disease - continue telemetry>>no high grade block noted - had prior 48 hour heart monitor (results above), can repeat at discharge  Bradycardia - heart rate in the upper 40-50s - no high grade AV block on telemetry - check tSH - not on rate lowering medications, may have baseline low heart rate  HTN - PTA amlodipine  daily - BP mildly elevated  Thrombocytosis - per IM    For questions or updates, please contact [facility] Please consult [email or web address] for contact info under    Signed, Neera Teng David Stall, PA-C  06/10/2021 4:45 PM

## 2021-06-10 NOTE — ED Provider Notes (Signed)
[facility] Provider Note    Event Date/Time   First MD Initiated Contact with Patient 06/10/21 1133     (approximate)   History   syncope  HPI  Gabriel Bennett is a 71 y.o. male presents to the ER after syncopal event.  Patient was with his wife who is currently here in the hospital admitted for recent stroke.  States that he started feeling he was about to pass out to become diaphoretic denying chest pain.  Felt weak all over.  Did have some nausea with it.  Did not hit his head when he fell noticed reported seizure activity.  States he feels weak and fatigued.  Denies any abdominal pain.  Has a history of GI bleed but denies any melena.     Physical Exam   Triage Vital Signs: ED Triage Vitals  Enc Vitals Group     BP 06/10/21 1051 (!) 158/93     Pulse Rate 06/10/21 1051 (!) 51     Resp 06/10/21 1051 19     Temp 06/10/21 1051 97.6 F (36.4 C)     Temp src --      SpO2 06/10/21 1051 95 %     Weight --      Height --      Head Circumference --      Peak Flow --      Pain Score 06/10/21 1049 0     Pain Loc --      Pain Edu? --      Excl. in GC? --     Most recent vital signs: Vitals:   06/10/21 1153 06/10/21 1234  BP:  (!) 141/95  Pulse: (!) 46 (!) 51  Resp: 17 14  Temp:    SpO2: 93% 98%     Constitutional: Alert  Eyes: Conjunctivae are normal.  Head: Atraumatic. Nose: No congestion/rhinnorhea. Mouth/Throat: Mucous membranes are moist.   Neck: Painless ROM.  Cardiovascular:   bradycardic, no m/g/r Respiratory: Normal respiratory effort.  No retractions.  Gastrointestinal: Soft and nontender.  Musculoskeletal:  no deformity Neurologic:  MAE spontaneously. No gross focal neurologic deficits are appreciated.  Skin:  Skin is warm, dry and intact. No rash noted. Psychiatric: Mood and affect are normal. Speech and behavior are normal.    ED Results / Procedures / Treatments   Labs (all labs ordered are listed, but only abnormal  results are displayed) Labs Reviewed  CBC - Abnormal; Notable for the following components:      Result Value   Hemoglobin 12.8 (*)    HCT 38.7 (*)    MCV 74.0 (*)    MCH 24.5 (*)    RDW 18.1 (*)    Platelets 1,144 (*)    All other components within normal limits  BASIC METABOLIC PANEL  URINALYSIS, ROUTINE W REFLEX MICROSCOPIC  PATHOLOGIST SMEAR REVIEW  CBG MONITORING, ED  TROPONIN I (HIGH SENSITIVITY)  TROPONIN I (HIGH SENSITIVITY)     EKG  ED ECG REPORT I, Willy Eddy, the attending physician, personally viewed and interpreted this ECG.   Date: 06/10/2021  EKG Time: 10:49  Rate: 55  Rhythm: sinus  Axis: normal  Intervals: first degree block  ST&T Change: non spcific st abn, no stemi    RADIOLOGY Please see ED Course for my review and interpretation.  I personally reviewed all radiographic images ordered to evaluate for the above acute complaints and reviewed radiology reports and findings.  These findings were personally discussed with the  patient.  Please see medical record for radiology report.    PROCEDURES:  Critical Care performed: No  Procedures   MEDICATIONS ORDERED IN ED: Medications  sodium chloride 0.9 % bolus 500 mL (500 mLs Intravenous New Bag/Given 06/10/21 1153)     IMPRESSION / MDM / ASSESSMENT AND PLAN / ED COURSE  I reviewed the triage vital signs and the nursing notes.                              Differential diagnosis includes, but is not limited to, dysrhythmia, anemia, dehydration, electrolyte abnormality, CVA, sepsis  Presenting to the ER for evaluation of symptoms as described above.  He is protecting his airway does appear fatigued and rundown noted to be bradycardic on EKG appears to be sinus rhythm.  This presenting complaint could reflect a potentially life-threatening illness therefore the patient will be placed on continuous pulse oximetry and telemetry for monitoring.  Laboratory evaluation will be sent to evaluate for  the above complaints.       Clinical Course as of 06/10/21 1307  Wed Jun 10, 2021  1255 CT head by my interpretation does not show any evidence of bleed. [PR]  1305 Patient observed on telemetry monitor and having bradycardic episodes in the low 40s.  Troponin negative.  Based on his syncopal episode I do feel that he warrants hospitalization for further cardiac monitoring.  Hospitalist consulted for admission. [PR]    Clinical Course User Index [PR] Willy Eddy, MD    Patient's presentation is most consistent with acute presentation with potential threat to life or bodily function.   FINAL CLINICAL IMPRESSION(S) / ED DIAGNOSES   Final diagnoses:  Syncope and collapse  Bradycardia     Rx / DC Orders   ED Discharge Orders     None        Note:  This document was prepared using Dragon voice recognition software and may include unintentional dictation errors.    Willy Eddy, MD 06/10/21 [phone]

## 2021-06-10 NOTE — Assessment & Plan Note (Signed)
Patient noted to have thrombocytosis with associated microcytic anemia This may be reactive and due to iron deficiency Obtain iron panel Hematology consult

## 2021-06-11 ENCOUNTER — Observation Stay (HOSPITAL_BASED_OUTPATIENT_CLINIC_OR_DEPARTMENT_OTHER)
Admit: 2021-06-11 | Discharge: 2021-06-11 | Disposition: A | Discharge status: Home / Self Care | Payer: Medicare Other | Attending: Physician Assistant | Admitting: Physician Assistant

## 2021-06-11 DIAGNOSIS — D75839 Thrombocytosis, unspecified: Secondary | ICD-10-CM | POA: Diagnosis not present

## 2021-06-11 DIAGNOSIS — R001 Bradycardia, unspecified: Secondary | ICD-10-CM | POA: Diagnosis not present

## 2021-06-11 DIAGNOSIS — R55 Syncope and collapse: Secondary | ICD-10-CM | POA: Diagnosis not present

## 2021-06-11 DIAGNOSIS — I10 Essential (primary) hypertension: Secondary | ICD-10-CM | POA: Diagnosis not present

## 2021-06-11 LAB — BASIC METABOLIC PANEL
Anion gap: 8 (ref 5–15)
BUN: 11 mg/dL (ref 8–23)
CO2: 25 mmol/L (ref 22–32)
Calcium: 9.2 mg/dL (ref 8.9–10.3)
Chloride: 105 mmol/L (ref 98–111)
Creatinine, Ser: 1.12 mg/dL (ref 0.61–1.24)
GFR, Estimated: >60 mL/min (ref >60)
Glucose, Bld: 90 mg/dL (ref 70–99)
Potassium: 3.7 mmol/L (ref 3.5–5.1)
Sodium: 138 mmol/L (ref 135–145)

## 2021-06-11 LAB — CBC
HCT: 34.8 % — ABNORMAL LOW (ref 39.0–52.0)
Hemoglobin: 11.5 g/dL — ABNORMAL LOW (ref 13.0–17.0)
MCH: 24.3 pg — ABNORMAL LOW (ref 26.0–34.0)
MCHC: 33 g/dL (ref 30.0–36.0)
MCV: 73.6 fL — ABNORMAL LOW (ref 80.0–100.0)
Platelets: 870 10*3/uL — ABNORMAL HIGH (ref 150–400)
RBC: 4.73 MIL/uL (ref 4.22–5.81)
RDW: 18.1 % — ABNORMAL HIGH (ref 11.5–15.5)
WBC: 9.2 10*3/uL (ref 4.0–10.5)
nRBC: 0 % (ref 0.0–0.2)

## 2021-06-11 LAB — GLUCOSE, CAPILLARY: Glucose-Capillary: 97 mg/dL (ref 70–99)

## 2021-06-11 NOTE — Progress Notes (Signed)
Progress Note  Patient Name: Gabriel Bennett Date of Encounter: 06/11/2021  [facility] Cardiologist: new to [facility]  Subjective   No events overnight, reports he has been ambulating to the bathroom without symptoms.  Denies having orthostasis symptoms at home Reports having remote episode of syncope 1 year ago December 2022 in the setting of upper GI bleed Reports recent stressors, wife in hospital with stroke, hemiparesis, new fracture of other side, needs to go to rehab.  Reports eating drinking normally Reports after standing from a sitting position at her bedside, felt nauseous, diaphoretic, denied loss of consciousness, noted to have bradycardia in the ER heart rates high 40s, low 50s  Inpatient Medications    Scheduled Meds:  enoxaparin (LOVENOX) injection  40 mg Subcutaneous Q24H   sodium chloride flush  3 mL Intravenous Q12H   sodium chloride flush  3 mL Intravenous Q12H   Continuous Infusions:  sodium chloride     PRN Meds: sodium chloride, acetaminophen **OR** acetaminophen, ondansetron **OR** ondansetron (ZOFRAN) IV, sodium chloride flush   Vital Signs    Vitals:   06/11/21 0014 06/11/21 0551 06/11/21 0631 06/11/21 0725  BP: 113/72 132/83  (!) 145/81  Pulse: 60 (!) 50  (!) 50  Resp: Temp: 98 F (36.7 C) 97.8 F (36.6 C)  (!) 97.3 F (36.3 C)  TempSrc:      SpO2: 97% 97%  96%  Weight:   127.2 kg     Intake/Output Summary (Last 24 hours) at 06/11/2021 1048 Last data filed at 06/10/2021 1906 Gross per 24 hour  Intake 750 ml  Output --  Net 750 ml      06/11/2021    6:31 AM 06/26/2019   11:13 AM 07/25/2018    8:03 AM  Last 3 Weights  Weight (lbs) 280 lb 6.4 oz 280 lb 280 lb  Weight (kg) 127.189 kg 127.007 kg 127.007 kg      Telemetry    Sinus bradycardia- Personally Reviewed  ECG    Sinus bradycardia- Personally Reviewed  Physical Exam   GEN: No acute distress.   Neck: No JVD Cardiac: RRR, no murmurs, rubs, or gallops.  Respiratory:  Clear to auscultation bilaterally. GI: Soft, nontender, non-distended  MS: No edema; No deformity. Neuro:  Nonfocal  Psych: Normal affect   Labs    High Sensitivity Troponin:   Recent Labs  Lab 06/10/21 1047  TROPONINIHS 11     Chemistry Recent Labs  Lab 06/10/21 1047 06/10/21 1555 06/11/21 0514  NA 137  --  138  K 3.7  --  3.7  CL 106  --  105  CO2 25  --  25  GLUCOSE 90  --  90  BUN 13  --  11  CREATININE 1.24  --  1.12  CALCIUM 9.6  --  9.2  MG  --  2.2  --   GFRNONAA >60  --  >60  ANIONGAP 6  --  8    Lipids No results for input(s): CHOL, TRIG, HDL, LABVLDL, LDLCALC, CHOLHDL in the last 168 hours.  Hematology Recent Labs  Lab 06/10/21 1047 06/11/21 0514  WBC 9.3 9.2  RBC 5.23 4.73  HGB 12.8* 11.5*  HCT 38.7* 34.8*  MCV 74.0* 73.6*  MCH 24.5* 24.3*  MCHC 33.1 33.0  RDW 18.1* 18.1*  PLT 1,144* 870*   Thyroid  Recent Labs  Lab 06/10/21 1541  TSH 1.102    BNPNo results for input(s): BNP, PROBNP in the  last 168 hours.  DDimer No results for input(s): DDIMER in the last 168 hours.   Radiology    CT HEAD WO CONTRAST ( )  Result Date: 06/10/2021 CLINICAL DATA:  Provided history: Syncope/presyncope, cerebrovascular cause suspected. EXAM: CT HEAD WITHOUT CONTRAST TECHNIQUE: Contiguous axial images were obtained from the base of the skull through the vertex without intravenous contrast. RADIATION DOSE REDUCTION: This exam was performed according to the departmental dose-optimization program which includes automated exposure control, adjustment of the mA and/or kV according to patient size and/or use of iterative reconstruction technique. COMPARISON:  Head CT 07/25/2018. FINDINGS: Brain: Cerebral volume is normal. Advanced patchy and ill-defined hypoattenuation within the cerebral white matter, nonspecific but compatible with chronic small vessel ischemic disease. 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  Chronic small vessel ischemic changes are also present within the thalami. There is no acute intracranial hemorrhage. No demarcated cortical infarct. No extra-axial fluid collection. No evidence of an intracranial mass. No midline shift. Vascular: No hyperdense vessel. Atherosclerotic calcifications. Skull: No fracture or aggressive osseous lesion. Sinuses/Orbits: No mass or acute finding within the imaged orbits. Trace mucosal thickening within the bilateral sphenoid sinuses. IMPRESSION: No evidence of acute intracranial abnormality. Advanced chronic small vessel ischemic changes within the cerebral white matter. Chronic small vessel ischemic changes also present within the thalami. Electronically Signed   By: Jackey Loge D.O.   On: 06/10/2021 13:04   DG Chest Portable 1 View  Result Date: 06/10/2021 CLINICAL DATA:  Syncope EXAM: PORTABLE CHEST 1 VIEW COMPARISON:  07/25/2018 FINDINGS: Heart size and vascularity within normal limits. Mild bibasilar airspace disease right greater than left. No effusion. Upper lobes clear. IMPRESSION: Mild bibasilar airspace disease right greater than left. Probable atelectasis. Electronically Signed   By: Marlan Palau M.D.   On: 06/10/2021 12:13   ECHOCARDIOGRAM COMPLETE  Result Date: 06/10/2021    ECHOCARDIOGRAM REPORT   Patient Name:   Gabriel Bennett Date of Exam: 06/10/2021 Medical Rec #:  [id number]  Height:       75.0 in Accession #:    [id number] Weight:       280.0 lb Date of Birth:  1950/04/08  BSA:          2.532 m Patient Age:    70 years   BP:           141/95 mmHg Patient Gender: M          HR:           53 bpm. Exam Location:  [facility] Procedure: 2D Echo, Color Doppler and Cardiac Doppler Indications:     R55 Syncope  History:         Patient has no prior history of Echocardiogram examinations.                  Signs/Symptoms:Dizziness/Lightheadedness; Risk                  Factors:Hypertension.  Sonographer:     Humphrey Rolls Referring Phys:  [id number] HYIFOYDX AGBATA  Diagnosing Phys: Debbe Odea MD  Sonographer Comments: Suboptimal subcostal window. IMPRESSIONS  1. Left ventricular ejection fraction, by estimation, is 60 to 65%. The left ventricle has normal function. The left ventricle has no regional wall motion abnormalities. There is mild left ventricular hypertrophy. Left ventricular diastolic parameters were normal.  2. Right ventricular systolic function is normal. The right ventricular size is normal.  3. The mitral valve is normal in structure. Mild mitral  valve regurgitation.  4. The aortic valve is tricuspid. Aortic valve regurgitation is mild. Aortic valve sclerosis/calcification is present, without any evidence of aortic stenosis.  5. The inferior vena cava is dilated in size with <50% respiratory variability, suggesting right atrial pressure of 15 mmHg. FINDINGS  Left Ventricle: Left ventricular ejection fraction, by estimation, is 60 to 65%. The left ventricle has normal function. The left ventricle has no regional wall motion abnormalities. The left ventricular internal cavity size was normal in size. There is  mild left ventricular hypertrophy. Left ventricular diastolic parameters were normal. Right Ventricle: The right ventricular size is normal. No increase in right ventricular wall thickness. Right ventricular systolic function is normal. Left Atrium: Left atrial size was normal in size. Right Atrium: Right atrial size was normal in size. Pericardium: There is no evidence of pericardial effusion. Mitral Valve: The mitral valve is normal in structure. Mild mitral valve regurgitation. MV peak gradient, 1.6 mmHg. The mean mitral valve gradient is 1.0 mmHg. Tricuspid Valve: The tricuspid valve is normal in structure. Tricuspid valve regurgitation is mild. Aortic Valve: The aortic valve is tricuspid. Aortic valve regurgitation is mild. Aortic valve sclerosis/calcification is present, without any evidence of aortic stenosis. Aortic valve mean gradient  measures 4.0 mmHg. Aortic valve peak gradient measures 7.6 mmHg. Aortic valve area, by VTI measures 2.62 cm. Pulmonic Valve: The pulmonic valve was not well visualized. Pulmonic valve regurgitation is not visualized. Aorta: The aortic root and ascending aorta are structurally normal, with no evidence of dilitation. Venous: The inferior vena cava is dilated in size with less than 50% respiratory variability, suggesting right atrial pressure of 15 mmHg. IAS/Shunts: No atrial level shunt detected by color flow Doppler.  LEFT VENTRICLE PLAX 2D LVIDd:         4.65 cm   Diastology LVIDs:         2.97 cm   LV e' medial:    7.18 cm/s LV PW:         1.22 cm   LV E/e' medial:  7.5 LV IVS:        0.96 cm   LV e' lateral:   8.38 cm/s LVOT diam:     2.30 cm   LV E/e' lateral: 6.4 LV SV:         68 LV SV Index:   27 LVOT Area:     4.15 cm  RIGHT VENTRICLE RV Basal diam:  3.36 cm LEFT ATRIUM             Index LA diam:        2.60 cm 1.03 cm/m LA Vol (A2C):   50.3 ml 19.87 ml/m LA Vol (A4C):   36.5 ml 14.42 ml/m LA Biplane Vol: 43.0 ml 16.98 ml/m  AORTIC VALVE                    PULMONIC VALVE AV Area (Vmax):    2.57 cm     PV Vmax:       0.89 m/s AV Area (Vmean):   2.62 cm     PV Vmean:      68.700 cm/s AV Area (VTI):     2.62 cm     PV VTI:        0.180 m AV Vmax:           138.00 cm/s  PV Peak grad:  3.2 mmHg AV Vmean:          87.700 cm/s  PV Mean  grad:  2.0 mmHg AV VTI:            0.258 m AV Peak Grad:      7.6 mmHg AV Mean Grad:      4.0 mmHg LVOT Vmax:         85.50 cm/s LVOT Vmean:        55.200 cm/s LVOT VTI:          0.163 m LVOT/AV VTI ratio: 0.63  AORTA Ao Root diam: 3.10 cm MITRAL VALVE               TRICUSPID VALVE MV Area (PHT): 2.21 cm    TR Peak grad:   20.2 mmHg MV Area VTI:   2.94 cm    TR Vmax:        225.00 cm/s MV Peak grad:  1.6 mmHg MV Mean grad:  1.0 mmHg    SHUNTS MV Vmax:       0.62 m/s    Systemic VTI:  0.16 m MV Vmean:      33.2 cm/s   Systemic Diam: 2.30 cm MV Decel Time: 344 msec MV E  velocity: 54.00 cm/s MV A velocity: 43.70 cm/s MV E/A ratio:  1.24 Debbe OdeaBrian Agbor-Etang MD Electronically signed by Debbe OdeaBrian Agbor-Etang MD Signature Date/Time: 06/10/2021/5:03:58 PM    Final     Cardiac Studies   Echo as above  Patient Profile     Gabriel MaduroRobert Bennett is a 71 y.o. male with a hx of GIB, HTN, tremor, thrombocytosis who is being seen 06/10/2021 for the evaluation of near syncope  Assessment & Plan    Near syncope Baseline bradycardia, Unable to exclude vasovagal episode Echocardiogram with no acute findings, normal ejection fraction -No medication changes made -Plan for Zio monitor at discharge Recommend he stay hydrated, avoid missing meals Discussed strategies if he has recurrent symptoms, sitting or laying down until symptoms resolve  Bradycardia Has remained in the 50s Not on beta-blocker or calcium channel blocker, they should be avoided Recommend he stay hydrated, Zio monitor as above  Essential hypertension Recommend he continue his amlodipine 5  Thrombocytosis New finding this admission Consider outpatient work-up with hematology  Long discussion with patient concerning events from yesterday, discussed echocardiogram findings,orders for Zio monitor placed, discussed with cardiopulmonary for placement  Total encounter time more than 50 minutes  Greater than 50% was spent in counseling and coordination of care with the patient   For questions or updates, please contact [facility] Please consult [email or web address] for contact info under        Signed, Julien Nordmannimothy Yulia Ulrich, MD  06/11/2021, 10:48 AM

## 2021-06-11 NOTE — TOC Initial Note (Signed)
Transition of Care [facility]) - Initial/Assessment Note    Patient Details  Name: Gabriel Bennett MRN: [id number] Date of Birth: 06-28-1950  [facility] CM/SW Contact:    Conception Oms, RN Phone Number: 06/11/2021, 11:56 AM  Clinical Narrative:                   Transition of Care [facility]) Screening Note   Patient Details  Name: Gabriel Bennett Date of Birth: 01-16-1951   Transition of Care [facility]) CM/SW Contact:    Conception Oms, RN Phone Number: 06/11/2021, 11:56 AM    Transition of Care Department [facility]) has reviewed patient and no TOC needs have been identified at this time. We will continue to monitor patient advancement through interdisciplinary progression rounds. If new patient transition needs arise, please place a TOC consult.         Patient Goals and CMS Choice        Expected Discharge Plan and Services           Expected Discharge Date: 06/11/21                                    Prior Living Arrangements/Services                       Activities of Daily Living Home Assistive Devices/Equipment: Kasandra Knudsen (specify quad or straight) ADL Screening (condition at time of admission) Patient's cognitive ability adequate to safely complete daily activities?: Yes Is the patient deaf or have difficulty hearing?: No Does the patient have difficulty seeing, even when wearing glasses/contacts?: No Does the patient have difficulty concentrating, remembering, or making decisions?: No Patient able to express need for assistance with ADLs?: Yes Does the patient have difficulty dressing or bathing?: No Independently performs ADLs?: Yes (appropriate for developmental age) Does the patient have difficulty walking or climbing stairs?: No Weakness of Legs: None Weakness of Arms/Hands: None  Permission Sought/Granted                  Emotional Assessment              Admission diagnosis:  Syncope and collapse [R55] Bradycardia  [R00.1] Syncope [R55] Near syncope [R55] Patient Active Problem List   Diagnosis Date Noted   Syncope 06/10/2021   Vasovagal near-syncope 06/10/2021   Hypertension    Sinus bradycardia    Thrombocytosis    PCP:  Toni Arthurs, PA Pharmacy:   [facility] [address] [place] [address] Phone: [phone] Fax: [id number] - [place], [place] - [address] [address] [place] [address] Phone: [phone] Fax: [phone]     Social Determinants of Health (SDOH) Interventions    Readmission Risk Interventions     View : No data to display.

## 2021-06-11 NOTE — Discharge Summary (Signed)
Physician Discharge Summary   Patient: Gabriel Bennett MRN: [id number] DOB: 1950-11-10  Admit date:     06/10/2021  Discharge date: 06/11/21  Discharge Physician: Emeterio Reeve   PCP: Toni Arthurs, PA   Recommendations at discharge:   Cardiology follow up for Zio patch, other w/u, monitor bradycardia See PCP in 1-2 weeks, check CMP, CBC - Plt were high ere can follow outpatient may need hematology referral   Discharge Diagnoses: Principal Problem:   Vasovagal near-syncope Active Problems:   Sinus bradycardia   Hypertension   Thrombocytosis  Resolved Problems:   * No resolved hospital problems. [facility] Course: Gabriel Bennett is a 71 year old male, PMH prior GI bleed, HTN, tremor, thrombocytosis. Patient presented to ED 06/10/2021 concern for lightheadedness/presyncope.  Brought from the medical floor where he was visiting his wife.  They were having discussion with palliative care regarding her illness/CODE STATUS, he became lightheaded, that conversation had to be paused for him to get evaluation in ED.  Reports prior syncopal episodes in the past but had GI bleed at that time.  No signs/symptoms of bleeding, no chest pain/SOB.  With acetic changes in the ED.  was bradycardic.  CXR showed atelectasis.  CT head no acute abnormality.  Cardiology consulted.  2D echo demonstrated ejection fraction 60 to 65%.  TSH within normal limits.  Thrombocytosis with initial platelet count 1144 06/11/21: Stable overnight, no significant events on telemetry.  Per cardiology, stable for discharge with Zio patch to follow-up with them outpatient.     Assessment and Plan:  * Vasovagal near-syncope Patient had a near syncopal episode which he described as feeling nauseous, dizzy, lightheaded and diaphoretic. He denied loss of consciousness but was noted to be bradycardic with heart rate in the 40s and 50s. Medications reviewed and patient is not on any beta-blockers or calcium channel blockers Obtain  TSH Obtain 2D echocardiogram to assess LVEF and rule out aortic stenosis Place patient on a cardiac monitor to rule out arrhythmias He had a 48 hour Holter scan in 01/23 which showed that the patient was in sinus rhythm, sinus bradycardia, sinus tachycardia and sinus arrhythmia. Ventricular ectopic activity consisted of multifocal PVCs?<   including couplets. Supraventricular ectopic activity consisted of PACs?<  including atrial pairs , bigeminy and trigeminy and 3 beat atrial run, 83 BPM. There were no pauses greater than 2.0 seconds noted  Sinus bradycardia Treatment as outlined in 1  Hypertension Blood pressure is stable Continue lisinopril  Thrombocytosis Patient noted to have thrombocytosis with associated microcytic anemia This may be reactive and due to iron deficiency Obtain iron panel Hematology consult       Consultants: cardiology Procedures performed: none  Disposition: Home Diet recommendation:  Discharge Diet Orders (From admission, onward)     Start     Ordered   06/11/21 0000  Diet - low sodium heart healthy        06/11/21 1130           Cardiac and Carb modified diet DISCHARGE MEDICATION: Allergies as of 06/11/2021   No Known Allergies      Medication List     STOP taking these medications    amLODipine 5 MG tablet Commonly known as: NORVASC   furosemide 20 MG tablet Commonly known as: Lasix   predniSONE 10 MG tablet Commonly known as: DELTASONE       TAKE these medications    acetaminophen 500 MG tablet Commonly known as: TYLENOL Take 500 mg by mouth every  6 (six) hours as needed.   atorvastatin 40 MG tablet Commonly known as: LIPITOR Take 40 mg by mouth daily.   Ferrous Fumarate 324 MG Tabs Take 325 mg by mouth daily in the afternoon.   folic acid 1 MG tablet Commonly known as: FOLVITE Take 1 mg by mouth daily.   pantoprazole 40 MG tablet Commonly known as: PROTONIX Take 40 mg by mouth daily.   primidone 50 MG  tablet Commonly known as: MYSOLINE Take 100 mg by mouth at bedtime.   sildenafil 50 MG tablet Commonly known as: VIAGRA Take 50 mg by mouth daily as needed for erectile dysfunction.        Discharge Exam: Filed Weights   06/11/21 0631  Weight: 127.2 kg   Constitutional:  VSS, see nurse notes General Appearance: alert, well-developed, well-nourished, NAD Eyes: Normal lids and conjunctive, non-icteric sclera Ears, Nose, Mouth, Throat: Normal appearance Neck: No masses, trachea midline Respiratory: Normal respiratory effort Breath sounds diminished bilaterally Cardiovascular: S1/S2 normal, no murmur/rub/gallop auscultated No lower extremity edema Gastrointestinal: Nontender, no masses Habitus limits exam  Musculoskeletal:  No clubbing/cyanosis of digits Neurological: No cranial nerve deficit on limited exam Psychiatric: Normal judgment/insight Normal mood and affect   Condition at discharge: good  The results of significant diagnostics from this hospitalization (including imaging, microbiology, ancillary and laboratory) are listed below for reference.   Imaging Studies: CT HEAD WO CONTRAST (5MM)  Result Date: 06/10/2021 CLINICAL DATA:  Provided history: Syncope/presyncope, cerebrovascular cause suspected. EXAM: CT HEAD WITHOUT CONTRAST TECHNIQUE: Contiguous axial images were obtained from the base of the skull through the vertex without intravenous contrast. RADIATION DOSE REDUCTION: This exam was performed according to the departmental dose-optimization program which includes automated exposure control, adjustment of the mA and/or kV according to patient size and/or use of iterative reconstruction technique. COMPARISON:  Head CT 07/25/2018. FINDINGS: Brain: Cerebral volume is normal. Advanced patchy and ill-defined hypoattenuation within the cerebral white matter, nonspecific but compatible with chronic small vessel ischemic disease. 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 Chronic small vessel ischemic changes are also present within the thalami. There is no acute intracranial hemorrhage. No demarcated cortical infarct. No extra-axial fluid collection. No evidence of an intracranial mass. No midline shift. Vascular: No hyperdense vessel. Atherosclerotic calcifications. Skull: No fracture or aggressive osseous lesion. Sinuses/Orbits: No mass or acute finding within the imaged orbits. Trace mucosal thickening within the bilateral sphenoid sinuses. IMPRESSION: No evidence of acute intracranial abnormality. Advanced chronic small vessel ischemic changes within the cerebral white matter. Chronic small vessel ischemic changes also present within the thalami. Electronically Signed   By: Kellie Simmering D.O.   On: 06/10/2021 13:04   DG Chest Portable 1 View  Result Date: 06/10/2021 CLINICAL DATA:  Syncope EXAM: PORTABLE CHEST 1 VIEW COMPARISON:  07/25/2018 FINDINGS: Heart size and vascularity within normal limits. Mild bibasilar airspace disease right greater than left. No effusion. Upper lobes clear. IMPRESSION: Mild bibasilar airspace disease right greater than left. Probable atelectasis. Electronically Signed   By: Franchot Gallo M.D.   On: 06/10/2021 12:13   ECHOCARDIOGRAM COMPLETE  Result Date: 06/10/2021    ECHOCARDIOGRAM REPORT   Patient Name:   Gabriel Bennett Date of Exam: 06/10/2021 Medical Rec #:  [id number]  Height:       75.0 in Accession #:    [id number] Weight:       280.0 lb Date of Birth:  03-17-1950  BSA:  2.532 m Patient Age:    41 years   BP:           141/95 mmHg Patient Gender: M          HR:           53 bpm. Exam Location:  [facility] Procedure: 2D Echo, Color Doppler and Cardiac Doppler Indications:     R55 Syncope  History:         Patient has no prior history of Echocardiogram examinations.                  Signs/Symptoms:Dizziness/Lightheadedness; Risk                  Factors:Hypertension.   Sonographer:     Charmayne Sheer Referring Phys:  [id number] AGBATA Diagnosing Phys: Kate Sable MD  Sonographer Comments: Suboptimal subcostal window. IMPRESSIONS  1. Left ventricular ejection fraction, by estimation, is 60 to 65%. The left ventricle has normal function. The left ventricle has no regional wall motion abnormalities. There is mild left ventricular hypertrophy. Left ventricular diastolic parameters were normal.  2. Right ventricular systolic function is normal. The right ventricular size is normal.  3. The mitral valve is normal in structure. Mild mitral valve regurgitation.  4. The aortic valve is tricuspid. Aortic valve regurgitation is mild. Aortic valve sclerosis/calcification is present, without any evidence of aortic stenosis.  5. The inferior vena cava is dilated in size with <50% respiratory variability, suggesting right atrial pressure of 15 mmHg. FINDINGS  Left Ventricle: Left ventricular ejection fraction, by estimation, is 60 to 65%. The left ventricle has normal function. The left ventricle has no regional wall motion abnormalities. The left ventricular internal cavity size was normal in size. There is  mild left ventricular hypertrophy. Left ventricular diastolic parameters were normal. Right Ventricle: The right ventricular size is normal. No increase in right ventricular wall thickness. Right ventricular systolic function is normal. Left Atrium: Left atrial size was normal in size. Right Atrium: Right atrial size was normal in size. Pericardium: There is no evidence of pericardial effusion. Mitral Valve: The mitral valve is normal in structure. Mild mitral valve regurgitation. MV peak gradient, 1.6 mmHg. The mean mitral valve gradient is 1.0 mmHg. Tricuspid Valve: The tricuspid valve is normal in structure. Tricuspid valve regurgitation is mild. Aortic Valve: The aortic valve is tricuspid. Aortic valve regurgitation is mild. Aortic valve sclerosis/calcification is present,  without any evidence of aortic stenosis. Aortic valve mean gradient measures 4.0 mmHg. Aortic valve peak gradient measures 7.6 mmHg. Aortic valve area, by VTI measures 2.62 cm. Pulmonic Valve: The pulmonic valve was not well visualized. Pulmonic valve regurgitation is not visualized. Aorta: The aortic root and ascending aorta are structurally normal, with no evidence of dilitation. Venous: The inferior vena cava is dilated in size with less than 50% respiratory variability, suggesting right atrial pressure of 15 mmHg. IAS/Shunts: No atrial level shunt detected by color flow Doppler.  LEFT VENTRICLE PLAX 2D LVIDd:         4.65 cm   Diastology LVIDs:         2.97 cm   LV e' medial:    7.18 cm/s LV PW:         1.22 cm   LV E/e' medial:  7.5 LV IVS:        0.96 cm   LV e' lateral:   8.38 cm/s LVOT diam:     2.30 cm   LV E/e' lateral: 6.4  LV SV:         68 LV SV Index:   27 LVOT Area:     4.15 cm  RIGHT VENTRICLE RV Basal diam:  3.36 cm LEFT ATRIUM             Index LA diam:        2.60 cm 1.03 cm/m LA Vol (A2C):   50.3 ml 19.87 ml/m LA Vol (A4C):   36.5 ml 14.42 ml/m LA Biplane Vol: 43.0 ml 16.98 ml/m  AORTIC VALVE                    PULMONIC VALVE AV Area (Vmax):    2.57 cm     PV Vmax:       0.89 m/s AV Area (Vmean):   2.62 cm     PV Vmean:      68.700 cm/s AV Area (VTI):     2.62 cm     PV VTI:        0.180 m AV Vmax:           138.00 cm/s  PV Peak grad:  3.2 mmHg AV Vmean:          87.700 cm/s  PV Mean grad:  2.0 mmHg AV VTI:            0.258 m AV Peak Grad:      7.6 mmHg AV Mean Grad:      4.0 mmHg LVOT Vmax:         85.50 cm/s LVOT Vmean:        55.200 cm/s LVOT VTI:          0.163 m LVOT/AV VTI ratio: 0.63  AORTA Ao Root diam: 3.10 cm MITRAL VALVE               TRICUSPID VALVE MV Area (PHT): 2.21 cm    TR Peak grad:   20.2 mmHg MV Area VTI:   2.94 cm    TR Vmax:        225.00 cm/s MV Peak grad:  1.6 mmHg MV Mean grad:  1.0 mmHg    SHUNTS MV Vmax:       0.62 m/s    Systemic VTI:  0.16 m MV Vmean:       33.2 cm/s   Systemic Diam: 2.30 cm MV Decel Time: 344 msec MV E velocity: 54.00 cm/s MV A velocity: 43.70 cm/s MV E/A ratio:  1.24 Kate Sable MD Electronically signed by Kate Sable MD Signature Date/Time: 06/10/2021/5:03:58 PM    Final     Microbiology: No results found for this or any previous visit.  Labs: CBC: Recent Labs  Lab 06/10/21 1047 06/11/21 0514  WBC 9.3 9.2  HGB 12.8* 11.5*  HCT 38.7* 34.8*  MCV 74.0* 73.6*  PLT 1,144* [id number]*   Basic Metabolic Panel: Recent Labs  Lab 06/10/21 1047 06/10/21 1555 06/11/21 0514  NA 137  --  138  K 3.7  --  3.7  CL 106  --  105  CO2 25  --  25  GLUCOSE 90  --  90  BUN 13  --  11  CREATININE 1.24  --  1.12  CALCIUM 9.6  --  9.2  MG  --  2.2  --    Liver Function Tests: No results for input(s): AST, ALT, ALKPHOS, BILITOT, PROT, ALBUMIN in the last 168 hours. CBG: Recent Labs  Lab 06/11/21 0551  GLUCAP 97    Discharge  time spent: greater than 30 minutes.  Signed: Emeterio Reeve, DO [facility] 06/11/2021

## 2021-06-11 NOTE — Consult Note (Signed)
[place]  [phone] [phone]  ID: Gabriel Bennett OB: August 29, 1950  MR#: [id number]  [id number]  Patient Care Team: Toni Arthurs, PA as PCP - General (Internal Medicine)  CHIEF COMPLAINT: Thrombocytosis and mild iron deficiency anemia.  INTERVAL HISTORY: Patient is a 71 year old male who was recently admitted to the hospital with a presyncopal episode.  Laboratory work revealed a platelet count of greater than 1 million with a mild iron deficiency anemia.  Currently, he feels improved since admission and back to his baseline.  He has no neurologic complaints.  He denies any recent fevers or illnesses.  He has a good appetite and denies weight loss.  He has no chest pain, shortness of breath, cough, or hemoptysis.  He has no nausea, vomiting, constipation, or diarrhea.  He has no urinary complaints.  Patient offers no further specific complaints today.  REVIEW OF SYSTEMS:   Review of Systems  Constitutional: Negative.  Negative for fever, malaise/fatigue and weight loss.  Respiratory: Negative.  Negative for cough, hemoptysis and shortness of breath.   Cardiovascular: Negative.  Negative for chest pain and leg swelling.  Gastrointestinal: Negative.  Negative for abdominal pain.  Genitourinary: Negative.  Negative for dysuria.  Musculoskeletal: Negative.  Negative for back pain.  Skin: Negative.  Negative for rash.  Neurological: Negative.  Negative for dizziness, focal weakness, weakness and headaches.  Psychiatric/Behavioral: Negative.  The patient is not nervous/anxious.    As per HPI. Otherwise, a complete review of systems is negative.  PAST MEDICAL HISTORY: Past Medical History:  Diagnosis Date   Hypertension     PAST SURGICAL HISTORY: No past surgical history on file.  FAMILY HISTORY: Family History  Problem Relation Age of Onset   Coronary artery disease Mother     ADVANCED DIRECTIVES (Y/N):  @ADVDIR @  HEALTH  MAINTENANCE: Social History   Tobacco Use   Smoking status: Never   Smokeless tobacco: Never  Substance Use Topics   Alcohol use: Yes    Comment: twice per month     Colonoscopy:  PAP:  Bone density:  Lipid panel:  No Known Allergies  Current Outpatient Medications  Medication Sig Dispense Refill   acetaminophen (TYLENOL) 500 MG tablet Take 500 mg by mouth every 6 (six) hours as needed.     atorvastatin (LIPITOR) 40 MG tablet Take 40 mg by mouth daily.     Ferrous Fumarate 324 MG TABS Take 325 mg by mouth daily in the afternoon.     folic acid (FOLVITE) 1 MG tablet Take 1 mg by mouth daily.     pantoprazole (PROTONIX) 40 MG tablet Take 40 mg by mouth daily.     primidone (MYSOLINE) 50 MG tablet Take 100 mg by mouth at bedtime.     sildenafil (VIAGRA) 50 MG tablet Take 50 mg by mouth daily as needed for erectile dysfunction.     No current facility-administered medications for this encounter.   Facility-Administered Medications Ordered in Other Encounters  Medication Dose Route Frequency Provider Last Rate Last Admin   0.9 %  sodium chloride infusion  250 mL Intravenous PRN Agbata, Tochukwu, MD       acetaminophen (TYLENOL) tablet 650 mg  650 mg Oral Q6H PRN Agbata, Tochukwu, MD       Or   acetaminophen (TYLENOL) suppository 650 mg  650 mg Rectal Q6H PRN Agbata, Tochukwu, MD       enoxaparin (LOVENOX) injection 40 mg  40 mg Subcutaneous Q24H Agbata, Tochukwu, MD  40 mg at 06/10/21 1554   ondansetron (ZOFRAN) tablet 4 mg  4 mg Oral Q6H PRN Agbata, Tochukwu, MD       Or   ondansetron (ZOFRAN) injection 4 mg  4 mg Intravenous Q6H PRN Agbata, Tochukwu, MD       sodium chloride flush (NS) 0.9 % injection 3 mL  3 mL Intravenous Q12H Agbata, Tochukwu, MD   3 mL at 06/11/21 0855   sodium chloride flush (NS) 0.9 % injection 3 mL  3 mL Intravenous Q12H Agbata, Tochukwu, MD   3 mL at 06/11/21 0855   sodium chloride flush (NS) 0.9 % injection 3 mL  3 mL Intravenous PRN Agbata, Tochukwu,  MD        OBJECTIVE: There were no vitals filed for this visit.   There is no height or weight on file to calculate BMI.    ECOG FS:0 - Asymptomatic  General: Well-developed, well-nourished, no acute distress. Eyes: Pink conjunctiva, anicteric sclera. HEENT: Normocephalic, moist mucous membranes. Lungs: No audible wheezing or coughing. Heart: Regular rate and rhythm. Abdomen: Soft, nontender, no obvious distention. Musculoskeletal: No edema, cyanosis, or clubbing. Neuro: Alert, answering all questions appropriately. Cranial nerves grossly intact. Skin: No rashes or petechiae noted. Psych: Normal affect. Lymphatics: No cervical, calvicular, axillary or inguinal LAD.   LAB RESULTS:  Lab Results  Component Value Date   NA 138 06/11/2021   K 3.7 06/11/2021   CL 105 06/11/2021   CO2 25 06/11/2021   GLUCOSE 90 06/11/2021   BUN 11 06/11/2021   CREATININE 1.12 06/11/2021   CALCIUM 9.2 06/11/2021   GFRNONAA >60 06/11/2021   GFRAA >60 06/26/2019    Lab Results  Component Value Date   WBC 9.2 06/11/2021   NEUTROABS 3.5 06/26/2019   HGB 11.5 (L) 06/11/2021   HCT 34.8 (L) 06/11/2021   MCV 73.6 (L) 06/11/2021   PLT 870 (H) 06/11/2021     STUDIES: CT HEAD WO CONTRAST (5MM)  Result Date: 06/10/2021 CLINICAL DATA:  Provided history: Syncope/presyncope, cerebrovascular cause suspected. EXAM: CT HEAD WITHOUT CONTRAST TECHNIQUE: Contiguous axial images were obtained from the base of the skull through the vertex without intravenous contrast. RADIATION DOSE REDUCTION: This exam was performed according to the departmental dose-optimization program which includes automated exposure control, adjustment of the mA and/or kV according to patient size and/or use of iterative reconstruction technique. COMPARISON:  Head CT 07/25/2018. FINDINGS: Brain: Cerebral volume is normal. Advanced patchy and ill-defined hypoattenuation within the cerebral white matter, nonspecific but compatible with chronic  small vessel ischemic disease. 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 Chronic small vessel ischemic changes are also present within the thalami. There is no acute intracranial hemorrhage. No demarcated cortical infarct. No extra-axial fluid collection. No evidence of an intracranial mass. No midline shift. Vascular: No hyperdense vessel. Atherosclerotic calcifications. Skull: No fracture or aggressive osseous lesion. Sinuses/Orbits: No mass or acute finding within the imaged orbits. Trace mucosal thickening within the bilateral sphenoid sinuses. IMPRESSION: No evidence of acute intracranial abnormality. Advanced chronic small vessel ischemic changes within the cerebral white matter. Chronic small vessel ischemic changes also present within the thalami. Electronically Signed   By: Kellie Simmering D.O.   On: 06/10/2021 13:04   DG Chest Portable 1 View  Result Date: 06/10/2021 CLINICAL DATA:  Syncope EXAM: PORTABLE CHEST 1 VIEW COMPARISON:  07/25/2018 FINDINGS: Heart size and vascularity within normal limits. Mild bibasilar airspace disease right greater than left. No effusion. Upper lobes  clear. IMPRESSION: Mild bibasilar airspace disease right greater than left. Probable atelectasis. Electronically Signed   By: Franchot Gallo M.D.   On: 06/10/2021 12:13   ECHOCARDIOGRAM COMPLETE  Result Date: 06/10/2021    ECHOCARDIOGRAM REPORT   Patient Name:   Gabriel Bennett Date of Exam: 06/10/2021 Medical Rec #:  [id number]  Height:       75.0 in Accession #:    [id number] Weight:       280.0 lb Date of Birth:  11-Oct-1950  BSA:          2.532 m Patient Age:    73 years   BP:           141/95 mmHg Patient Gender: M          HR:           53 bpm. Exam Location:  [facility] Procedure: 2D Echo, Color Doppler and Cardiac Doppler Indications:     R55 Syncope  History:         Patient has no prior history of Echocardiogram examinations.                   Signs/Symptoms:Dizziness/Lightheadedness; Risk                  Factors:Hypertension.  Sonographer:     Charmayne Sheer Referring Phys:  [id number] AGBATA Diagnosing Phys: Kate Sable MD  Sonographer Comments: Suboptimal subcostal window. IMPRESSIONS  1. Left ventricular ejection fraction, by estimation, is 60 to 65%. The left ventricle has normal function. The left ventricle has no regional wall motion abnormalities. There is mild left ventricular hypertrophy. Left ventricular diastolic parameters were normal.  2. Right ventricular systolic function is normal. The right ventricular size is normal.  3. The mitral valve is normal in structure. Mild mitral valve regurgitation.  4. The aortic valve is tricuspid. Aortic valve regurgitation is mild. Aortic valve sclerosis/calcification is present, without any evidence of aortic stenosis.  5. The inferior vena cava is dilated in size with <50% respiratory variability, suggesting right atrial pressure of 15 mmHg. FINDINGS  Left Ventricle: Left ventricular ejection fraction, by estimation, is 60 to 65%. The left ventricle has normal function. The left ventricle has no regional wall motion abnormalities. The left ventricular internal cavity size was normal in size. There is  mild left ventricular hypertrophy. Left ventricular diastolic parameters were normal. Right Ventricle: The right ventricular size is normal. No increase in right ventricular wall thickness. Right ventricular systolic function is normal. Left Atrium: Left atrial size was normal in size. Right Atrium: Right atrial size was normal in size. Pericardium: There is no evidence of pericardial effusion. Mitral Valve: The mitral valve is normal in structure. Mild mitral valve regurgitation. MV peak gradient, 1.6 mmHg. The mean mitral valve gradient is 1.0 mmHg. Tricuspid Valve: The tricuspid valve is normal in structure. Tricuspid valve regurgitation is mild. Aortic Valve: The aortic valve is tricuspid.  Aortic valve regurgitation is mild. Aortic valve sclerosis/calcification is present, without any evidence of aortic stenosis. Aortic valve mean gradient measures 4.0 mmHg. Aortic valve peak gradient measures 7.6 mmHg. Aortic valve area, by VTI measures 2.62 cm. Pulmonic Valve: The pulmonic valve was not well visualized. Pulmonic valve regurgitation is not visualized. Aorta: The aortic root and ascending aorta are structurally normal, with no evidence of dilitation. Venous: The inferior vena cava is dilated in size with less than 50% respiratory variability, suggesting right atrial pressure of 15 mmHg. IAS/Shunts: No atrial level shunt detected  by color flow Doppler.  LEFT VENTRICLE PLAX 2D LVIDd:         4.65 cm   Diastology LVIDs:         2.97 cm   LV e' medial:    7.18 cm/s LV PW:         1.22 cm   LV E/e' medial:  7.5 LV IVS:        0.96 cm   LV e' lateral:   8.38 cm/s LVOT diam:     2.30 cm   LV E/e' lateral: 6.4 LV SV:         68 LV SV Index:   27 LVOT Area:     4.15 cm  RIGHT VENTRICLE RV Basal diam:  3.36 cm LEFT ATRIUM             Index LA diam:        2.60 cm 1.03 cm/m LA Vol (A2C):   50.3 ml 19.87 ml/m LA Vol (A4C):   36.5 ml 14.42 ml/m LA Biplane Vol: 43.0 ml 16.98 ml/m  AORTIC VALVE                    PULMONIC VALVE AV Area (Vmax):    2.57 cm     PV Vmax:       0.89 m/s AV Area (Vmean):   2.62 cm     PV Vmean:      68.700 cm/s AV Area (VTI):     2.62 cm     PV VTI:        0.180 m AV Vmax:           138.00 cm/s  PV Peak grad:  3.2 mmHg AV Vmean:          87.700 cm/s  PV Mean grad:  2.0 mmHg AV VTI:            0.258 m AV Peak Grad:      7.6 mmHg AV Mean Grad:      4.0 mmHg LVOT Vmax:         85.50 cm/s LVOT Vmean:        55.200 cm/s LVOT VTI:          0.163 m LVOT/AV VTI ratio: 0.63  AORTA Ao Root diam: 3.10 cm MITRAL VALVE               TRICUSPID VALVE MV Area (PHT): 2.21 cm    TR Peak grad:   20.2 mmHg MV Area VTI:   2.94 cm    TR Vmax:        225.00 cm/s MV Peak grad:  1.6 mmHg MV Mean grad:   1.0 mmHg    SHUNTS MV Vmax:       0.62 m/s    Systemic VTI:  0.16 m MV Vmean:      33.2 cm/s   Systemic Diam: 2.30 cm MV Decel Time: 344 msec MV E velocity: 54.00 cm/s MV A velocity: 43.70 cm/s MV E/A ratio:  1.24 Kate Sable MD Electronically signed by Kate Sable MD Signature Date/Time: 06/10/2021/5:03:58 PM    Final     ASSESSMENT: Thrombocytosis and mild iron deficiency anemia.  PLAN:    1.  Thrombocytosis: Patient's initial platelet count on admission was 1144, but now has improved to 870.  This is likely reactive in the setting of a mild iron deficiency.  No intervention is needed.  No follow-up is necessary in the cancer center. 2.  Iron deficiency anemia: Patient has a history of GI bleeds.  Recommend oral iron supplementation.  Patient does not require IV iron at this time. 3.  Presyncope: Resolved.  Patient is being discharged today.  Appreciate consult, call with questions.   Lloyd Huger, MD   06/11/2021 5:24 PM

## 2021-06-29 NOTE — Addendum Note (Signed)
Encounter addended by: Jeannette How on: 06/29/2021 7:59 AM  Actions taken: Imaging Exam ended

## 2021-06-30 ENCOUNTER — Telehealth: Payer: Self-pay

## 2021-06-30 NOTE — Telephone Encounter (Signed)
Spoke w/ pt.  Advised him of Ryan's recommendations. He verbalizes understanding and reports that he has an appt w/ his PCP tomorrow, but no upcoming appt w/ cardiology. Advised him that I will send message to scheduling and they will contact him to set this up.  He is appreciative of the call.

## 2021-06-30 NOTE — Telephone Encounter (Signed)
-----   Message from Sondra Barges, PA-C sent at 06/30/2021 11:21 AM EDT ----- Heart monitor showed a predominant rhythm of sinus with an average rate of 63 bpm (range 41 to 226 bpm, 2 episodes of NSVT with the longest and fastest episode lasting 4 beats, 1 run of SVT lasting 10 beats, occasional premature beats from the top portion of the heart, and rare premature beats from the bottom portion of the heart.  No sustained arrhythmias to suggest etiology of syncope.  Please ensure he has follow-up with MD.

## 2021-06-30 NOTE — Telephone Encounter (Signed)
Attempted to schedule appt with [facility] or APP.  Lmov .

## 2021-06-30 NOTE — Telephone Encounter (Signed)
Left message for pt to call back  °

## 2021-07-02 NOTE — Telephone Encounter (Signed)
Left voicemail message to call back so that we can schedule post hospital follow up.

## 2021-07-03 NOTE — Telephone Encounter (Signed)
Assisted with getting patient scheduled for post hospital follow up. I was able to schedule him to see Cadence Furth PA-C. Advised that if he is not able to keep that appointment to please give our office a call. He verbalized understanding with no further questions at this time.

## 2021-07-13 ENCOUNTER — Ambulatory Visit (INDEPENDENT_AMBULATORY_CARE_PROVIDER_SITE_OTHER): Payer: Medicare Other | Admitting: Medical

## 2021-07-13 ENCOUNTER — Encounter: Payer: Self-pay | Admitting: Medical

## 2021-07-13 VITALS — BP 120/72 | HR 61 | Ht 75.0 in | Wt 275.0 lb

## 2021-07-13 DIAGNOSIS — R001 Bradycardia, unspecified: Secondary | ICD-10-CM

## 2021-07-13 DIAGNOSIS — R55 Syncope and collapse: Secondary | ICD-10-CM | POA: Diagnosis not present

## 2021-07-13 DIAGNOSIS — I10 Essential (primary) hypertension: Secondary | ICD-10-CM | POA: Diagnosis not present

## 2021-10-13 ENCOUNTER — Ambulatory Visit: Payer: Medicare Other | Attending: Medical | Admitting: Medical

## 2021-10-13 NOTE — Progress Notes (Deleted)
Cardiology Office Note:    Date:  10/13/2021   ID:  Gabriel Bennett, DOB 30-Nov-1950, MRN [id number]  PCP:  Toni Arthurs, PA  [facility] Cardiologist:  None  [facility] Electrophysiologist:  None   Referring MD: Toni Arthurs, PA   Chief Complaint: 3 month follow-up  History of Present Illness:    Gabriel Bennett is a 71 y.o. male with a hx of  GIB, HTN, tremor, thrombocytosis who is being seen 06/10/2021 for the evaluation of near syncope.   H/o upper GIB in December 2022 with syncope and collapse. Subsequent Echo showed LVEF 56%, mild LVH. Heart monitor 48 hours showed SR, PVCs, brief SVT, no pauses.    The patient presented to the ER 06/10/21 for near syncope. He was sitting down when he started feeling nauseous and became diaphoretic, as if he was going to pass out. He felt weak all over. He stayed sitting in the chair and didn't pass out. CTH showed no acute abnormality. EKG shows SB, first degree AV block with no ischemic changes. Pre-syncope sounded vasovagal in nature. Echocardiogram with no acute findings, normal ejection fraction. Not on rate lowering medications. Heart monitor showed predominately NSR with average rate of 63bpm, 2 episodes of NSVT, 1 run SVT, occasional ectopy, no arrhythmias to explain syncope.  Last seen 07/13/21 and orthostatics were negative. No changes were made.   Past Medical History:  Diagnosis Date   Hypertension     No past surgical history on file.  Current Medications: No outpatient medications have been marked as taking for the 10/13/21 encounter (Appointment) with Kathlen Mody, Arvid Marengo H, PA-C.     Allergies:   Patient has no known allergies.   Social History   Socioeconomic History   Marital status: Married    Spouse name: Not on file   Number of children: Not on file   Years of education: Not on file   Highest education level: Not on file  Occupational History   Not on file  Tobacco Use   Smoking status: Never   Smokeless tobacco:  Never  Substance and Sexual Activity   Alcohol use: Yes    Comment: twice per month   Drug use: Not on file   Sexual activity: Not on file  Other Topics Concern   Not on file  Social History Narrative   Not on file   Social Determinants of Health   Financial Resource Strain: Not on file  Food Insecurity: Not on file  Transportation Needs: Not on file  Physical Activity: Not on file  Stress: Not on file  Social Connections: Not on file     Family History: The patient's family history includes Coronary artery disease in his mother.  ROS:   Please see the history of present illness.     All other systems reviewed and are negative.  EKGs/Labs/Other Studies Reviewed:    The following studies were reviewed today:  Heart monitor  06/2021 Patch Wear Time:  12 days and 23 hours (2023-05-25T11:54:16-0400 to 2023-06-07T11:09:12-398)   Patient had a min HR of 41 bpm, max HR of 226 bpm, and avg HR of 63 bpm. Predominant underlying rhythm was Sinus Rhythm. 2 Ventricular Tachycardia runs occurred, the run with the fastest interval lasting 4 beats with a max rate of 226 bpm, the longest  lasting 4 beats with an avg rate of 121 bpm. 1 run of Supraventricular Tachycardia occurred lasting 10 beats with a max rate of 125 bpm (avg 105 bpm). Idioventricular Rhythm  was present. Isolated SVEs were occasional (4.1%, [id number]), SVE Couplets were rare  (<1.0%, 825), and SVE Triplets were rare (<1.0%, 48). Isolated VEs were rare (<1.0%, 1607), VE Couplets were rare (<1.0%, 68), and VE Triplets were rare (<1.0%, 26). Ventricular Trigeminy was present.   Occasional nonsustained VT lasting 4 beats, occasional PACs 4.1% burden noted.  No sustained arrhythmias to suggest etiology of syncope noted.    Echo 06/10/21 1. Left ventricular ejection fraction, by estimation, is 60 to 65%. The  left ventricle has normal function. The left ventricle has no regional  wall motion abnormalities. There is mild left  ventricular hypertrophy.  Left ventricular diastolic parameters  were normal.   2. Right ventricular systolic function is normal. The right ventricular  size is normal.   3. The mitral valve is normal in structure. Mild mitral valve  regurgitation.   4. The aortic valve is tricuspid. Aortic valve regurgitation is mild.  Aortic valve sclerosis/calcification is present, without any evidence of  aortic stenosis.   5. The inferior vena cava is dilated in size with <50% respiratory  variability, suggesting right atrial pressure of 15 mmHg.   EKG:  EKG is *** ordered today.  The ekg ordered today demonstrates ***  Recent Labs: 06/10/2021: Magnesium 2.2; TSH 1.102 06/11/2021: BUN 11; Creatinine, Ser 1.12; Hemoglobin 11.5; Platelets 870; Potassium 3.7; Sodium 138  Recent Lipid Panel No results found for: "CHOL", "TRIG", "HDL", "CHOLHDL", "VLDL", "LDLCALC", "LDLDIRECT"   Risk Assessment/Calculations:   {Does this patient have ATRIAL FIBRILLATION?:[phone]}   Physical Exam:    VS:  There were no vitals taken for this visit.    Wt Readings from Last 3 Encounters:  07/13/21 275 lb (124.7 kg)  06/11/21 280 lb 6.4 oz (127.2 kg)  06/26/19 280 lb (127 kg)     GEN: *** Well nourished, well developed in no acute distress HEENT: Normal NECK: No JVD; No carotid bruits LYMPHATICS: No lymphadenopathy CARDIAC: ***RRR, no murmurs, rubs, gallops RESPIRATORY:  Clear to auscultation without rales, wheezing or rhonchi  ABDOMEN: Soft, non-tender, non-distended MUSCULOSKELETAL:  No edema; No deformity  SKIN: Warm and dry NEUROLOGIC:  Alert and oriented x 3 PSYCHIATRIC:  Normal affect   ASSESSMENT:    No diagnosis found. PLAN:    In order of problems listed above:  Pre-syncope  Bradycardia  HTN  Disposition: Follow up {follow up:15908} with ***   Shared Decision Making/Informed Consent   {Are you ordering a CV Procedure (e.g. stress test, cath, DCCV, TEE, etc)?   Press F2         :[id number]}    Signed, Zakarie Sturdivant Arlyss Repress  10/13/2021 7:33 AM    [facility]

## 2022-08-25 ENCOUNTER — Emergency Department
Admission: EM | Admit: 2022-08-25 | Discharge: 2022-08-25 | Disposition: A | Discharge status: Home / Self Care | Payer: Medicare Other | Attending: Emergency Medicine | Admitting: Emergency Medicine

## 2022-08-25 ENCOUNTER — Other Ambulatory Visit: Payer: Self-pay

## 2022-08-25 DIAGNOSIS — I10 Essential (primary) hypertension: Secondary | ICD-10-CM | POA: Diagnosis not present

## 2022-08-25 DIAGNOSIS — R059 Cough, unspecified: Secondary | ICD-10-CM | POA: Diagnosis present

## 2022-08-25 DIAGNOSIS — U071 COVID-19: Secondary | ICD-10-CM | POA: Diagnosis not present

## 2022-08-25 LAB — RESP PANEL BY RT-PCR (RSV, FLU A&B, COVID)  RVPGX2
Influenza A by PCR: NEGATIVE
Influenza B by PCR: NEGATIVE
Resp Syncytial Virus by PCR: NEGATIVE
SARS Coronavirus 2 by RT PCR: POSITIVE — AB

## 2022-08-25 MED ORDER — GUAIFENESIN-CODEINE 100-10 MG/5ML PO SOLN: 5.0000 mL | Freq: Four times a day (QID) | ORAL | 0 refills | Status: AC | PRN

## 2022-08-25 NOTE — ED Triage Notes (Signed)
Pt to ED via POV from home. Pt ambulatory to triage. Pt reports yesterday he started coughing and having a runny nose. Pt reports coughing kept him up last night. Pt denies sick contacts.

## 2022-08-25 NOTE — Discharge Instructions (Addendum)
Follow-up with your primary care provider if any continued problems or concerns.  Return to the emergency department if any severe worsening of your symptoms such as difficulty breathing or shortness of breath.  A prescription for guaifenesin with codeine was sent to the pharmacy for you to take as needed for cough and congestion.  Increase fluids and Tylenol or ibuprofen can be used for body aches and fever.  Take deep breaths every hour to help prevent development of pneumonia.

## 2022-08-25 NOTE — ED Provider Notes (Signed)
[facility] Provider Note    Event Date/Time   First MD Initiated Contact with Patient 08/25/22 [phone]     (approximate)   History   Cough   HPI  Gabriel Bennett is a 72 y.o. male presents to the ED with complaint of cough, sneezing and overall not feeling well that started suddenly yesterday.  Patient states that he coughed all evening.  No known sick exposures.  No medical history of asthma, bronchitis, smoking.  Does have a history of hypertension.     Physical Exam   Triage Vital Signs: ED Triage Vitals [08/25/22 0836]  Encounter Vitals Group     BP (!) 139/93     Systolic BP Percentile      Diastolic BP Percentile      Pulse Rate 81     Resp 20     Temp 98.4 F (36.9 C)     Temp Source Oral     SpO2 99 %     Weight      Height      Head Circumference      Peak Flow      Pain Score 7     Pain Loc      Pain Education      Exclude from Growth Chart     Most recent vital signs: Vitals:   08/25/22 0836 08/25/22 1004  BP: (!) 139/93 (!) 140/88  Pulse: 81 84  Resp: 20 18  Temp: 98.4 F (36.9 C)   SpO2: 99% 99%     General: Awake, no distress.  Able to talk in complete sentences without any difficulty. CV:  Good peripheral perfusion.  Heart regular rate and rhythm. Resp:  Normal effort.  Lungs are clear bilaterally. Abd:  No distention.  Other:     ED Results / Procedures / Treatments   Labs (all labs ordered are listed, but only abnormal results are displayed) Labs Reviewed  RESP PANEL BY RT-PCR (RSV, FLU A&B, COVID)  RVPGX2 - Abnormal; Notable for the following components:      Result Value   SARS Coronavirus 2 by RT PCR POSITIVE (*)    All other components within normal limits     PROCEDURES:  Critical Care performed:   Procedures   MEDICATIONS ORDERED IN ED: Medications - No data to display   IMPRESSION / MDM / ASSESSMENT AND PLAN / ED COURSE  I reviewed the triage vital signs and the nursing  notes.   Differential diagnosis includes, but is not limited to, viral illness, COVID, seasonal allergies, bronchitis.   72 year old male presents to the ED with complaint of sudden onset yesterday of cough and rhinorrhea.  Patient was afebrile at time of exam with an O2 sat of 99%.  COVID test was positive and patient was made aware.  A prescription for guaifenesin with codeine was sent to the pharmacy as patient states that he is unable to sleep due to the coughing.  He was made aware that he still needs to take deep breaths frequently and to walk about the house to help prevent pneumonia.  Patient agrees to do so as he has had COVID twice before.  Work note was given to him to remain out of work for 10 days.  He is aware that he should return to the emergency department immediately should he develop any shortness of breath or difficulty breathing.     Patient's presentation is most consistent with acute complicated illness / injury requiring  diagnostic workup.  FINAL CLINICAL IMPRESSION(S) / ED DIAGNOSES   Final diagnoses:  COVID     Rx / DC Orders   ED Discharge Orders          Ordered    guaiFENesin-codeine 100-10 MG/5ML syrup  Every 6 hours PRN        08/25/22 0952             Note:  This document was prepared using Dragon voice recognition software and may include unintentional dictation errors.   Tommi Rumps, PA-C 08/25/22 1050    Sharyn Creamer, MD 08/26/22 [phone]

## 2022-08-25 NOTE — ED Notes (Signed)
AAOx3.  C/O body aches and dry cough since last evening. Denies fever. No SOB/ DOE noted.

## 2023-12-09 IMAGING — CT CT HEAD W/O CM
4 series · 16 of 47 positions shown, 18 images · non-contrast
Comparison: Head CT 07/25/2018.

CLINICAL DATA: Provided history: Syncope/presyncope,
cerebrovascular cause suspected.



[Series 2: head wo · axial · 0.47mm/px · z∈[+249,+374]mm · 7 of 35 slices shown, 9 images]
[im 5/35  brain]
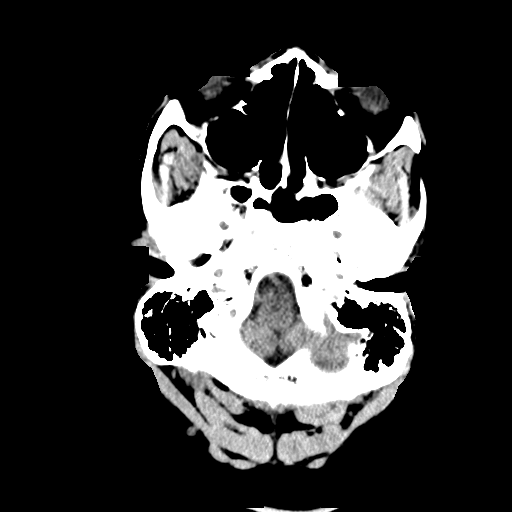
[im 5/35  bone]
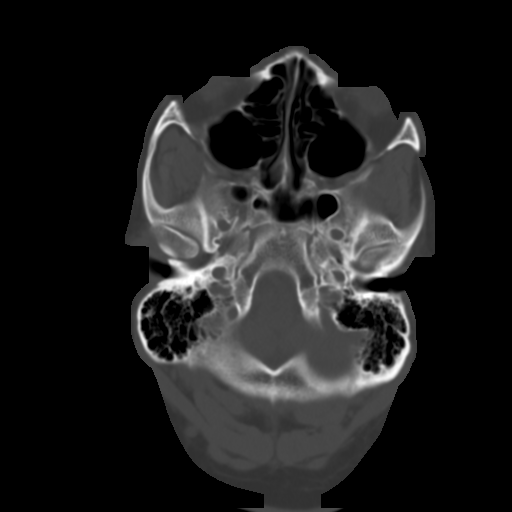
[im 9/35  brain]
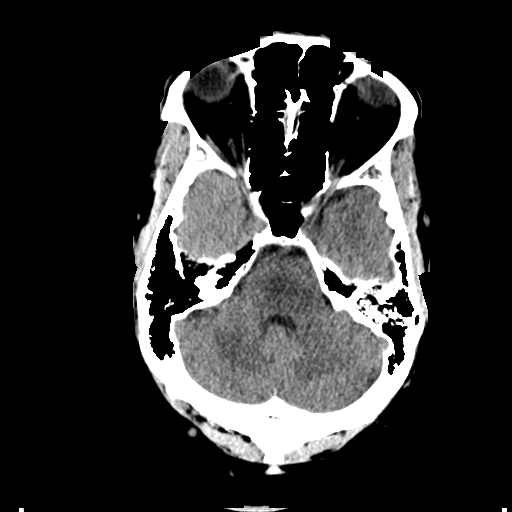
[im 13/35  brain]
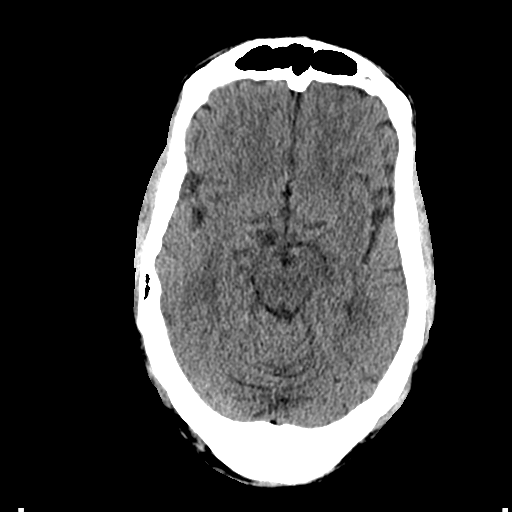
[im 18/35  brain]
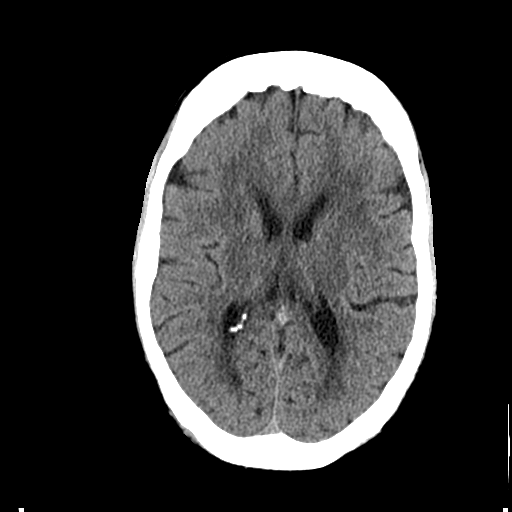
[im 22/35  brain]
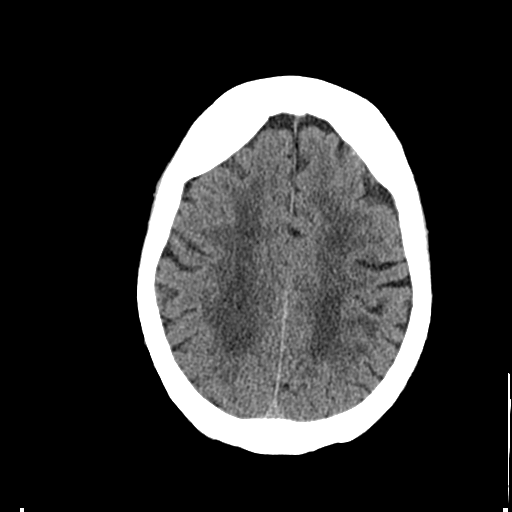
[im 22/35  bone]
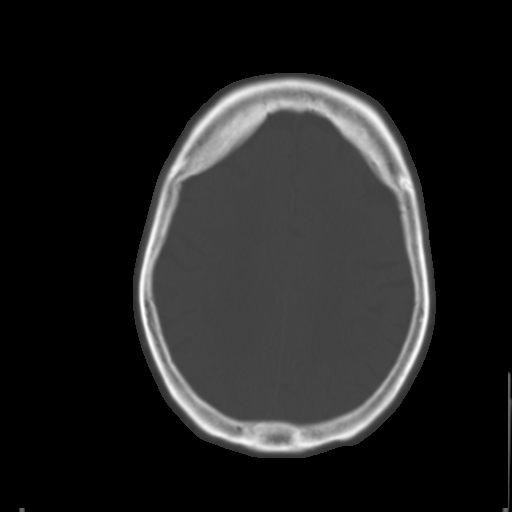
[im 26/35  brain]
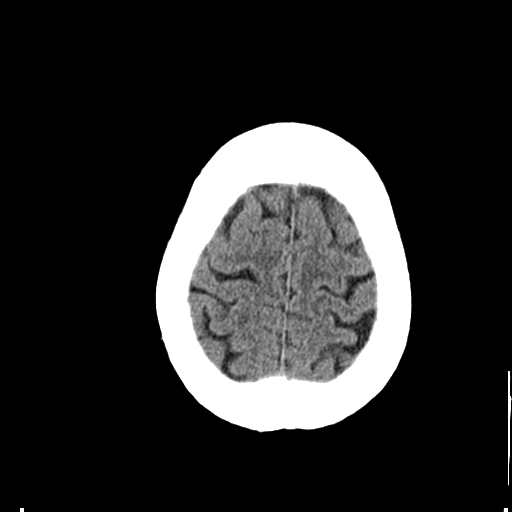
[im 30/35  brain]
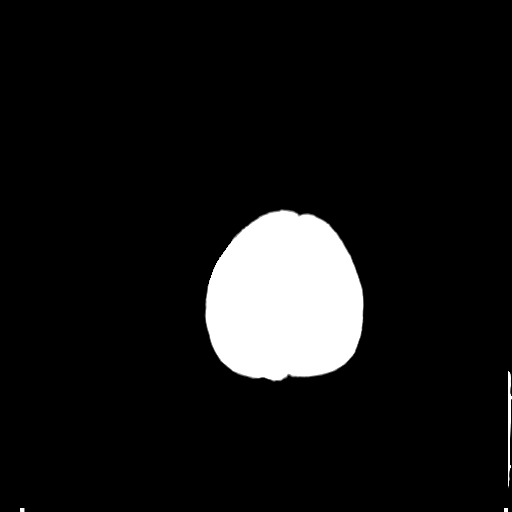

[Series 3: head bone · axial · 0.47mm/px · z∈[+245,+281]mm · 3 of 88 slices shown]
[im 9/88  bone]
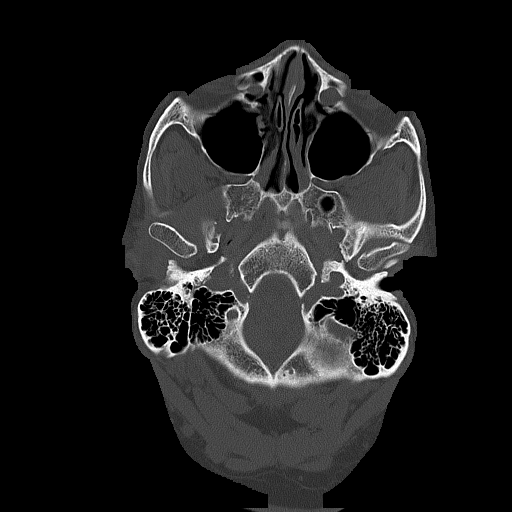
[im 18/88  bone]
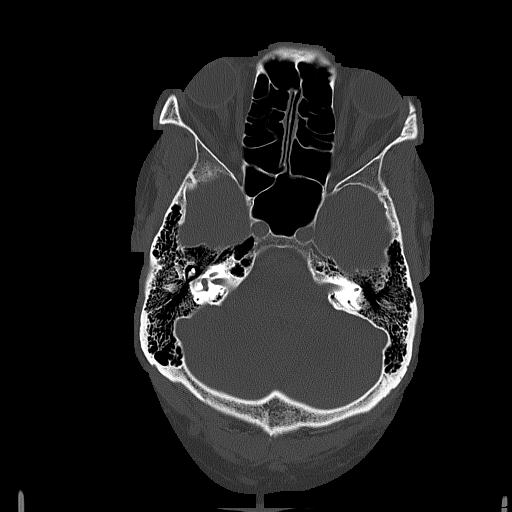
[im 27/88  bone]
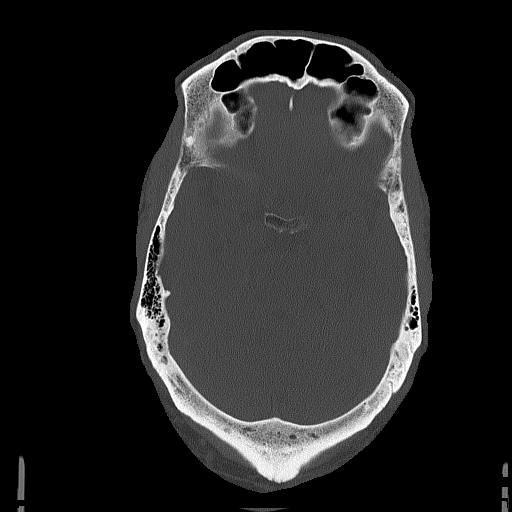

[Series 4: coronal soft tissue · coronal · 0.33mm/px · 3 of 78 slices shown]
[im 26/78  brain]
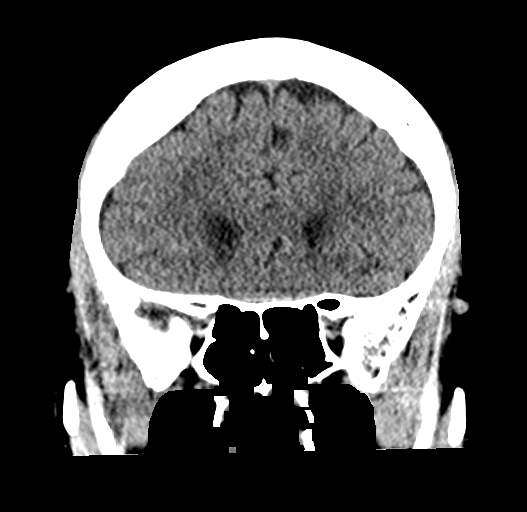
[im 35/78  brain]
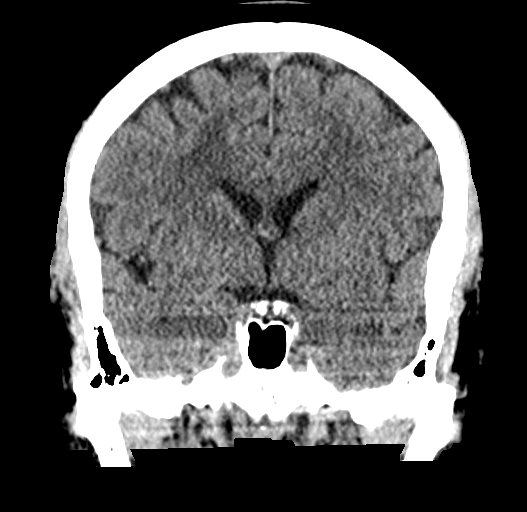
[im 43/78  brain]
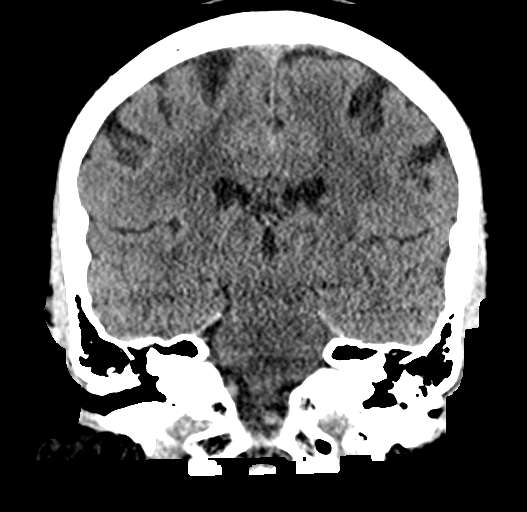

[Series 5: sagittal soft tissue · sagittal · 0.33mm/px · 3 of 58 slices shown]
[im 20/58  brain]
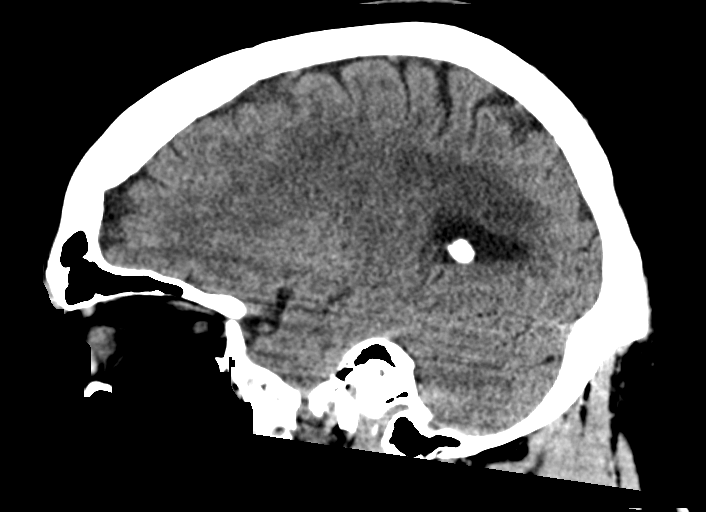
[im 29/58  brain]
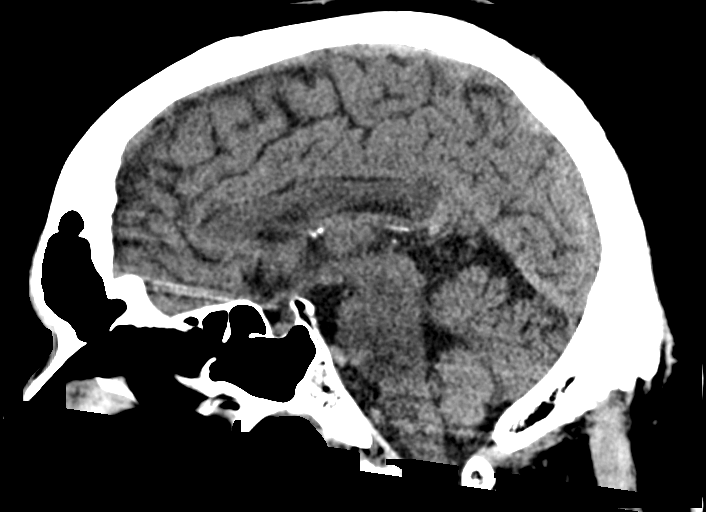
[im 39/58  brain]
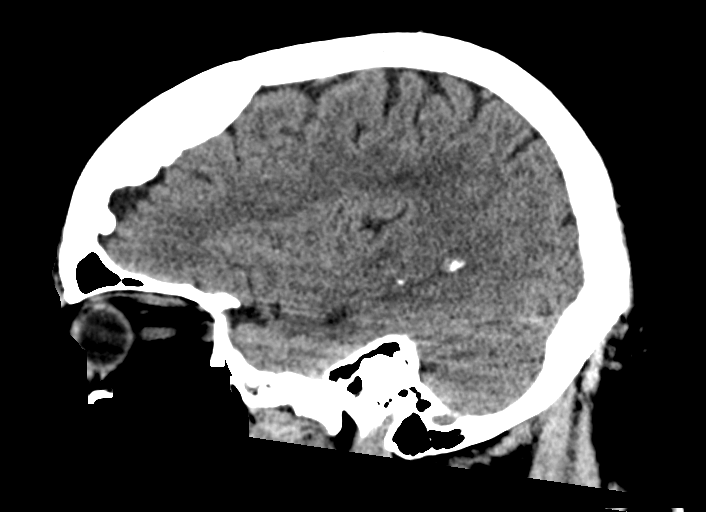

[16 of 47 positions shown; findings below may reference images not displayed]

FINDINGS: Brain:

Cerebral volume is normal.

Advanced patchy and ill-defined hypoattenuation within the cerebral
white matter, nonspecific but compatible with chronic small vessel
ischemic disease. Redemonstrated chronic small-vessel infarct within
the anterior left frontal lobe white matter.

Chronic small vessel ischemic changes are also present within the
thalami.

There is no acute intracranial hemorrhage.

No demarcated cortical infarct.

No extra-axial fluid collection.

No evidence of an intracranial mass.

No midline shift.

Vascular: No hyperdense vessel. Atherosclerotic calcifications.

Skull: No fracture or aggressive osseous lesion.

Sinuses/Orbits: No mass or acute finding within the imaged orbits.
Trace mucosal thickening within the bilateral sphenoid sinuses.
IMPRESSION: No evidence of acute intracranial abnormality.

Advanced chronic small vessel ischemic changes within the cerebral
white matter.

Chronic small vessel ischemic changes also present within the
thalami.
